# Patient Record
Sex: Female | Born: 2000
Health system: Southern US, Community
[De-identification: ages and names within clinical notes are randomized; demographics above are authoritative.]

## PROBLEM LIST (undated history)

## (undated) DIAGNOSIS — G43909 Migraine, unspecified, not intractable, without status migrainosus: Secondary | ICD-10-CM

## (undated) DIAGNOSIS — R51 Headache: Secondary | ICD-10-CM

## (undated) DIAGNOSIS — R519 Headache, unspecified: Secondary | ICD-10-CM

## (undated) DIAGNOSIS — F419 Anxiety disorder, unspecified: Secondary | ICD-10-CM

## (undated) HISTORY — DX: Headache, unspecified: R51.9

## (undated) HISTORY — DX: Headache: R51

## (undated) HISTORY — DX: Migraine, unspecified, not intractable, without status migrainosus: G43.909

## (undated) HISTORY — PX: KNEE SURGERY: SHX244

## (undated) HISTORY — DX: Anxiety disorder, unspecified: F41.9

## (undated) HISTORY — PX: WISDOM TOOTH EXTRACTION: SHX21

---
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
## 2018-01-26 ENCOUNTER — Encounter (HOSPITAL_COMMUNITY): Payer: Self-pay | Admitting: Emergency Medicine

## 2018-01-26 ENCOUNTER — Emergency Department (HOSPITAL_COMMUNITY): Payer: No Typology Code available for payment source

## 2018-01-26 ENCOUNTER — Emergency Department (HOSPITAL_COMMUNITY)
Admission: EM | Admit: 2018-01-26 | Discharge: 2018-01-26 | Disposition: A | Discharge status: Home / Self Care | Payer: No Typology Code available for payment source | Attending: Emergency Medicine | Admitting: Emergency Medicine

## 2018-01-26 DIAGNOSIS — M25561 Pain in right knee: Secondary | ICD-10-CM | POA: Insufficient documentation

## 2018-01-26 DIAGNOSIS — J45909 Unspecified asthma, uncomplicated: Secondary | ICD-10-CM | POA: Diagnosis not present

## 2018-01-26 DIAGNOSIS — Z79899 Other long term (current) drug therapy: Secondary | ICD-10-CM | POA: Diagnosis not present

## 2018-01-26 DIAGNOSIS — M549 Dorsalgia, unspecified: Secondary | ICD-10-CM | POA: Insufficient documentation

## 2018-01-26 DIAGNOSIS — M542 Cervicalgia: Secondary | ICD-10-CM | POA: Diagnosis not present

## 2018-01-26 DIAGNOSIS — R52 Pain, unspecified: Secondary | ICD-10-CM

## 2018-01-26 LAB — POC URINE PREG, ED: Preg Test, Ur: NEGATIVE

## 2018-01-26 MED ORDER — METHOCARBAMOL 500 MG PO TABS: 500.0000 mg | ORAL_TABLET | Freq: Two times a day (BID) | ORAL | 0 refills | Status: DC

## 2018-01-26 MED ORDER — NAPROXEN 500 MG PO TABS: 500.0000 mg | ORAL_TABLET | Freq: Two times a day (BID) | ORAL | 0 refills | Status: DC

## 2018-01-26 MED ORDER — METHOCARBAMOL 500 MG PO TABS
500.0000 mg | ORAL_TABLET | Freq: Once | ORAL | Status: AC
  Administered 2018-01-26: 500 mg via ORAL
  Filled 2018-01-26: qty 1

## 2018-01-26 MED ORDER — ACETAMINOPHEN 325 MG PO TABS
650.0000 mg | ORAL_TABLET | Freq: Once | ORAL | Status: AC
  Administered 2018-01-26: 650 mg via ORAL
  Filled 2018-01-26: qty 2
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
## 2018-01-26 NOTE — Discharge Instructions (Addendum)
1. Medications: alternate naprosyn and tylenol for pain control, robaxin (muscle relaxant), usual home medications. Do not drive or operate heavy machinery while taking muscle relaxant.  2. Treatment: rest, ice, elevate and use brace, drink plenty of fluids, gentle stretching 3. Follow Up: Please followup with orthopedics or your PCP in 1 week if no improvement for discussion of your diagnoses and further evaluation after today's visit; if you do not have a primary care doctor use the resource guide provided to find one; Please return to the ER for worsening symptoms or other concerns

## 2018-03-06 ENCOUNTER — Ambulatory Visit (HOSPITAL_COMMUNITY)
Admission: EM | Admit: 2018-03-06 | Discharge: 2018-03-06 | Disposition: A | Discharge status: Home / Self Care | Payer: No Typology Code available for payment source | Attending: Family Medicine | Admitting: Family Medicine

## 2018-03-06 ENCOUNTER — Other Ambulatory Visit: Payer: Self-pay

## 2018-03-06 ENCOUNTER — Encounter (HOSPITAL_COMMUNITY): Payer: Self-pay

## 2018-03-06 DIAGNOSIS — B349 Viral infection, unspecified: Secondary | ICD-10-CM | POA: Diagnosis not present

## 2018-03-06 DIAGNOSIS — H6593 Unspecified nonsuppurative otitis media, bilateral: Secondary | ICD-10-CM

## 2018-03-06 MED ORDER — AMOXICILLIN 875 MG PO TABS: 875.0000 mg | ORAL_TABLET | Freq: Two times a day (BID) | ORAL | 0 refills | Status: AC

## 2018-03-06 MED ORDER — IPRATROPIUM BROMIDE 0.06 % NA SOLN: 2.0000 | Freq: Four times a day (QID) | NASAL | 0 refills | Status: DC

## 2018-03-06 MED ORDER — MONTELUKAST SODIUM 10 MG PO TABS: 10.0000 mg | ORAL_TABLET | Freq: Every day | ORAL | 0 refills | Status: DC

## 2018-03-06 NOTE — ED Provider Notes (Signed)
[facility]    CSN: [id number] Arrival date & time: 03/06/18  1115     History   Chief Complaint Chief Complaint  Patient presents with  . Influenza    HPI Stacie Powell is a 18 y.o. female.   18 year old female comes in with father for 1 week history of URI symptoms.  Has had cough, rhinorrhea, nasal congestion, body aches, throat irritation.  Cough is nonproductive without shortness of breath or wheezing.  Denies fever, chills, night sweats.  States for the past few days, started having right ear pain.  She denies painful swallowing, trouble breathing, swelling of the throat, tripoding, drooling, trismus.  OTC cold medication with relief of nasal congestion.  Has not needed to use her inhaler.  Positive sick contact.  Never smoker.     Past Medical History:  Diagnosis Date  . Asthma     Patient Active Problem List   Diagnosis Date Noted  . Migraine without aura and with status migrainosus, not intractable 05/13/2015  . Migraine without aura and without status migrainosus, not intractable 05/13/2015  . Episodic tension-type headache, not intractable 05/13/2015    History reviewed. No pertinent surgical history.  OB History   No obstetric history on file.      Home Medications    Prior to Admission medications   Medication Sig Start Date End Date Taking? Authorizing Provider  amoxicillin (AMOXIL) 875 MG tablet Take 1 tablet (875 mg total) by mouth 2 (two) times daily for 7 days. 03/06/18 03/13/18  Tasia Catchings, Brittnae Aschenbrenner V, PA-C  desloratadine (CLARINEX REDITAB) 5 MG disintegrating tablet Take 5 mg by mouth daily. Reported on 02/14/2015    [provider]  diphenhydrAMINE (BENADRYL) 12.5 MG/5ML liquid Take 25 mg by mouth 4 (four) times daily as needed. Reported on 02/14/2015    [provider]  EPINEPHrine 0.3 mg/0.3 mL IJ SOAJ injection Use as directed for severe allergic reaction 08/30/14   Charmayne Sheer, NP  ibuprofen (ADVIL,MOTRIN) 800 MG tablet  Take 800 mg by mouth every 8 (eight) hours as needed.    [provider]  ipratropium (ATROVENT) 0.06 % nasal spray Place 2 sprays into both nostrils 4 (four) times daily. 03/06/18   Tasia Catchings, Josephine Rudnick V, PA-C  JUNEL FE 1.5/30 1.5-30 MG-MCG tablet Take 1 tablet by mouth daily. 04/17/15   [provider]  Menthol, Topical Analgesic, (BIOFREEZE EX) Apply topically.    [provider]  methocarbamol (ROBAXIN) 500 MG tablet Take 1 tablet (500 mg total) by mouth 2 (two) times daily. 01/26/18   Darlin Drop P, PA-C  montelukast (SINGULAIR) 10 MG tablet Take 1 tablet (10 mg total) by mouth at bedtime. 03/06/18   Tasia Catchings, Taitum Alms V, PA-C  naproxen (NAPROSYN) 500 MG tablet Take 1 tablet (500 mg total) by mouth 2 (two) times daily. 01/26/18   Darlin Drop P, PA-C  ondansetron (ZOFRAN ODT) 4 MG disintegrating tablet Take 1 tablet (4 mg total) by mouth every 8 (eight) hours as needed for nausea or vomiting. 11/19/15   Gareth Morgan, MD    Family History Family History  Problem Relation Age of Onset  . Migraines Mother   . Migraines Father   . Migraines Brother     Social History Social History   Tobacco Use  . Smoking status: Never Smoker  . Smokeless tobacco: Never Used  Substance Use Topics  . Alcohol use: No    Alcohol/week: 0.0 standard drinks  . Drug use: Not on file  Allergies   Cephalosporins; Eggs or egg-derived products; Fish allergy; Naproxen; Peanut-containing drug products; and Shellfish allergy   Review of Systems Review of Systems  Reason unable to perform ROS: See HPI as above.     Physical Exam Triage Vital Signs ED Triage Vitals [03/06/18 1317]  Enc Vitals Group     BP 113/69     Pulse Rate 83     Resp 18     Temp 97.9 F (36.6 C)     Temp Source Tympanic     SpO2 100 %     Weight 140 lb (63.5 kg)     Height      Head Circumference      Peak Flow      Pain Score 4     Pain Loc      Pain Edu?      Excl. in [place]?    No data found.  Updated  Vital Signs BP 113/69 (BP Location: Right Arm)   Pulse 83   Temp 97.9 F (36.6 C) (Tympanic)   Resp 18   Wt 140 lb (63.5 kg)   LMP 12/22/2017 Comment: depo  SpO2 100%   BMI 22.60 kg/m   Visual Acuity Right Eye Distance:   Left Eye Distance:   Bilateral Distance:    Right Eye Near:   Left Eye Near:    Bilateral Near:     Physical Exam Constitutional:      General: She is not in acute distress.    Appearance: She is well-developed. She is not ill-appearing, toxic-appearing or diaphoretic.  HENT:     Head: Normocephalic and atraumatic.     Right Ear: Ear canal and external ear normal. Tympanic membrane is not erythematous or bulging.     Left Ear: Ear canal and external ear normal. A middle ear effusion is present. Tympanic membrane is erythematous. Tympanic membrane is not bulging.     Ears:     Comments: Right TM opaque.     Nose: Rhinorrhea present. No congestion.     Right Sinus: No maxillary sinus tenderness or frontal sinus tenderness.     Left Sinus: No maxillary sinus tenderness or frontal sinus tenderness.     Mouth/Throat:     Mouth: Mucous membranes are moist.     Pharynx: Oropharynx is clear. Uvula midline.  Eyes:     Conjunctiva/sclera: Conjunctivae normal.     Pupils: Pupils are equal, round, and reactive to light.  Neck:     Musculoskeletal: Normal range of motion and neck supple.  Cardiovascular:     Rate and Rhythm: Normal rate and regular rhythm.     Heart sounds: Normal heart sounds. No murmur. No friction rub. No gallop.   Pulmonary:     Effort: Pulmonary effort is normal.     Breath sounds: Normal breath sounds. No decreased breath sounds, wheezing, rhonchi or rales.  Lymphadenopathy:     Cervical: No cervical adenopathy.  Skin:    General: Skin is warm and dry.  Neurological:     Mental Status: She is alert and oriented to person, place, and time.  Psychiatric:        Behavior: Behavior normal.        Judgment: Judgment normal.      UC  Treatments / Results  Labs (all labs ordered are listed, but only abnormal results are displayed) Labs Reviewed - No data to display  EKG None  Radiology No results found.  Procedures Procedures (including critical  care time)  Medications Ordered in UC Medications - No data to display  Initial Impression / Assessment and Plan / UC Course  I have reviewed the triage vital signs and the nursing notes.  Pertinent labs & imaging results that were available during my care of the patient were reviewed by me and considered in my medical decision making (see chart for details).    Discussed exam findings of the ear, will have patient trial Flonase and Atrovent first.  Written prescription of amoxicillin provided, can start for otitis media if symptoms not improving with nasal spray.  Other symptomatic treatment discussed.  Push fluids.  Return precautions given.  Patient expresses understanding and agrees to plan.  Final Clinical Impressions(s) / UC Diagnoses   Final diagnoses:  Viral illness  Fluid level behind tympanic membrane of both ears    ED Prescriptions    Medication Sig Dispense Auth. Provider   ipratropium (ATROVENT) 0.06 % nasal spray Place 2 sprays into both nostrils 4 (four) times daily. 15 mL Ade Stmarie V, PA-C   amoxicillin (AMOXIL) 875 MG tablet Take 1 tablet (875 mg total) by mouth 2 (two) times daily for 7 days. 14 tablet Azra Abrell V, PA-C   montelukast (SINGULAIR) 10 MG tablet Take 1 tablet (10 mg total) by mouth at bedtime. 30 tablet Tobin Chad, [place] 03/06/18 1414

## 2018-03-06 NOTE — ED Triage Notes (Signed)
Pt cc cough, congestion, body aches, stretchy throat x 1 week.

## 2018-03-06 NOTE — Discharge Instructions (Signed)
Start singulair/allergy medicine. Start flonase, atrovent nasal spray for nasal congestion/drainage. You can use over the counter nasal saline rinse such as neti pot for nasal congestion. Keep hydrated, your urine should be clear to pale yellow in color. Tylenol/motrin for fever and pain. Monitor for any worsening of symptoms, chest pain, shortness of breath, wheezing, swelling of the throat, follow up for reevaluation.   If ear pain worsens despite nasal spray, start amoxicillin for ear infection.  For sore throat/cough try using a honey-based tea. Use 3 teaspoons of honey with juice squeezed from half lemon. Place shaved pieces of ginger into 1/2-1 cup of water and warm over stove top. Then mix the ingredients and repeat every 4 hours as needed.

## 2018-04-07 ENCOUNTER — Ambulatory Visit (INDEPENDENT_AMBULATORY_CARE_PROVIDER_SITE_OTHER): Payer: No Typology Code available for payment source | Admitting: Family Medicine

## 2018-04-07 ENCOUNTER — Other Ambulatory Visit: Payer: Self-pay

## 2018-04-07 ENCOUNTER — Encounter: Payer: Self-pay | Admitting: Family Medicine

## 2018-04-07 VITALS — BP 116/82 | HR 104 | Temp 98.5°F | Ht 61.0 in | Wt 148.0 lb

## 2018-04-07 DIAGNOSIS — F419 Anxiety disorder, unspecified: Secondary | ICD-10-CM | POA: Insufficient documentation

## 2018-04-07 DIAGNOSIS — Z8669 Personal history of other diseases of the nervous system and sense organs: Secondary | ICD-10-CM | POA: Diagnosis not present

## 2018-04-07 DIAGNOSIS — J4520 Mild intermittent asthma, uncomplicated: Secondary | ICD-10-CM | POA: Insufficient documentation

## 2018-04-07 DIAGNOSIS — F329 Major depressive disorder, single episode, unspecified: Secondary | ICD-10-CM | POA: Insufficient documentation

## 2018-04-07 DIAGNOSIS — Z7689 Persons encountering health services in other specified circumstances: Secondary | ICD-10-CM

## 2018-04-07 LAB — CBC
HCT: 40.1 % (ref 36.0–49.0)
HEMOGLOBIN: 13.2 g/dL (ref 12.0–16.0)
MCHC: 32.8 g/dL (ref 31.0–37.0)
MCV: 87.6 fl (ref 78.0–98.0)
Platelets: 216 10*3/uL (ref 150.0–575.0)
RBC: 4.58 Mil/uL (ref 3.80–5.70)
RDW: 12.7 % (ref 11.4–15.5)
WBC: 6.1 10*3/uL (ref 4.5–13.5)

## 2018-04-07 LAB — T4, FREE: FREE T4: 0.79 ng/dL (ref 0.60–1.60)

## 2018-04-07 LAB — TSH: TSH: 0.63 u[IU]/mL (ref 0.40–5.00)

## 2018-04-07 NOTE — Progress Notes (Signed)
Patient presents to clinic today to establish care.  SUBJECTIVE: PMH:  Pt is a 18 yo female with pmh sig for history of asthma, anxiety, depression, migraines.  Patient has not had a provider in a while.  History of asthma: -More as a child -No recent symptoms -Had albuterol inhaler as needed  Anxiety/depression: -Symptoms since childhood -Notes increasing symptoms.  Took a semester off from school due to anxiety. -Notes anxiety about multiple things -States currently mood is good.  Sleep could be better, energy is okay. -Taking melatonin nightly -Went to counseling as a child when her parents got divorced -Interested in Designer, fashion/clothing -Has been trying to find a psychiatrist that will take her insurance ([place]) -pt notes her dad has a h/o schizophrenia, bipolar depression.  Pt taking care of her dad  s/p MI, trying to get him to take his medicine on a regular basis.  H/o migraines: -x yrs -triggered by stress and strong smells -may have 2 HAs/mo -endorses nausea -Pain in frontal and temporal areas of head -Take 800 mg ibuprofen or naproxen -Drinking 3 bottles of water per day, 1 cup of coffee  Allergies: Cephalosporins-hands  Social history: Patient is single.  Patient took a semester off from school 2/2 her anxiety.  Patient was in school at [employer] in [place], [place].  Patient is considering changing her major to early childhood education.  Patient is considering taking classes at [employer] over the summer.  Currently working at [employer].  Patient denies alcohol, tobacco, drug use.   Past Medical History:  Diagnosis Date  . Anxiety   . Asthma   . Frequent headaches   . Migraines     Past Surgical History:  Procedure Laterality Date  . KNEE SURGERY    . WISDOM TOOTH EXTRACTION      Current Outpatient Medications on File Prior to Visit  Medication Sig Dispense Refill  . desloratadine (CLARINEX REDITAB) 5 MG disintegrating tablet  Take 5 mg by mouth daily. Reported on 02/14/2015    . diphenhydrAMINE (BENADRYL) 12.5 MG/5ML liquid Take 25 mg by mouth 4 (four) times daily as needed. Reported on 02/14/2015    . EPINEPHrine 0.3 mg/0.3 mL IJ SOAJ injection Use as directed for severe allergic reaction 1 Device 1  . ibuprofen (ADVIL,MOTRIN) 800 MG tablet Take 800 mg by mouth every 8 (eight) hours as needed.    Marland Kitchen ipratropium (ATROVENT) 0.06 % nasal spray Place 2 sprays into both nostrils 4 (four) times daily. 15 mL 0  . Menthol, Topical Analgesic, (BIOFREEZE EX) Apply topically.    . methocarbamol (ROBAXIN) 500 MG tablet Take 1 tablet (500 mg total) by mouth 2 (two) times daily. 20 tablet 0  . montelukast (SINGULAIR) 10 MG tablet Take 1 tablet (10 mg total) by mouth at bedtime. 30 tablet 0  . naproxen (NAPROSYN) 500 MG tablet Take 1 tablet (500 mg total) by mouth 2 (two) times daily. 30 tablet 0  . ondansetron (ZOFRAN ODT) 4 MG disintegrating tablet Take 1 tablet (4 mg total) by mouth every 8 (eight) hours as needed for nausea or vomiting. 16 tablet 0   No current facility-administered medications on file prior to visit.     Allergies  Allergen Reactions  . Cephalosporins Hives  . Eggs Or Egg-Derived Products   . Fish Allergy   . Naproxen   . Peanut-Containing Drug Products   . Shellfish Allergy     Family History  Problem Relation Age of Onset  .  Migraines Mother   . Migraines Father   . Migraines Brother     Social History   Socioeconomic History  . Marital status: Single    Spouse name: Not on file  . Number of children: Not on file  . Years of education: Not on file  . Highest education level: Not on file  Occupational History  . Not on file  Social Needs  . Financial resource strain: Not on file  . Food insecurity:    Worry: Not on file    Inability: Not on file  . Transportation needs:    Medical: Not on file    Non-medical: Not on file  Tobacco Use  . Smoking status: Never Smoker  . Smokeless  tobacco: Never Used  Substance and Sexual Activity  . Alcohol use: No    Alcohol/week: 0.0 standard drinks  . Drug use: Not on file  . Sexual activity: Never    Birth control/protection: Abstinence, Injection  Lifestyle  . Physical activity:    Days per week: Not on file    Minutes per session: Not on file  . Stress: Not on file  Relationships  . Social connections:    Talks on phone: Not on file    Gets together: Not on file    Attends religious service: Not on file    Active member of club or organization: Not on file    Attends meetings of clubs or organizations: Not on file    Relationship status: Not on file  . Intimate partner violence:    Fear of current or ex partner: Not on file    Emotionally abused: Not on file    Physically abused: Not on file    Forced sexual activity: Not on file  Other Topics Concern  . Not on file  Social History Narrative   Chandria is an 11th grader at [employer]. She is doing very well. She libes with both parents and she has two brother, 66 yo & 46 yo. She enjoys eating, sleeping, and running track.    ROS General: Denies fever, chills, night sweats, changes in weight, changes in appetite HEENT: Denies ear pain, changes in vision, rhinorrhea, sore throat  +HAs CV: Denies CP, palpitations, SOB, orthopnea Pulm: Denies SOB, cough, wheezing GI: Denies abdominal pain, nausea, vomiting, diarrhea, constipation GU: Denies dysuria, hematuria, frequency, vaginal discharge Msk: Denies muscle cramps, joint pains Neuro: Denies weakness, numbness, tingling Skin: Denies rashes, bruising Psych: Denies hallucinations  + anxiety and depression   BP 116/82 (BP Location: Left Arm, Patient Position: Sitting, Cuff Size: Normal)   Pulse (!) 104   Temp 98.5 F (36.9 C) (Oral)   Ht 5\' 1"  (1.549 m)   Wt 148 lb (67.1 kg)   SpO2 98%   BMI 27.96 kg/m   Physical Exam Gen. Pleasant, well developed, well-nourished, in NAD HEENT - [place]/AT,  PERRL, no scleral icterus, no nasal drainage, pharynx without erythema or exudate.  TMs normal bilaterally. Neck: No JVD, no thyromegaly, no carotid bruits Lungs: no use of accessory muscles, CTAB, no wheezes, rales or rhonchi Cardiovascular: RRR, No r/g/m, no peripheral edema Abdomen: BS present, soft, nontender,nondistended Neuro:  A&Ox3, CN II-XII intact, normal gait Skin:  Warm, dry, intact, no lesions  Recent Results (from the past 2160 hour(s))  POC Urine Pregnancy, ED (do NOT order at [facility])     Status: None   Collection Time: 01/26/18  6:03 PM  Result Value Ref Range   Preg Test,  Ur NEGATIVE NEGATIVE    Comment:        THE SENSITIVITY OF THIS METHODOLOGY IS >24 mIU/mL     Assessment/Plan: Anxiety and depression -PHQ 9 score 19 -Gad 7 score 20 -Discussed making appointment with psychiatry.  Pt given a list of area [facility] providers if needed. -discussed starting SSRI after labs result.  Consider lexapro or paxil. - Plan: TSH, T4, free, CBC (no diff)  Mild intermittent asthma without complication -Controlled -We will continue to monitor  History of migraine -Given handout -Discussed ways to reduce headaches including increasing p.o. intake of water, limiting caffeine, getting plenty of rest, decreasing stress - Plan: CBC (no diff)  Encounter to establish care -We reviewed the PMH, PSH, FH, SH, Meds and Allergies. -We provided refills for any medications we will prescribe as needed. -We addressed current concerns per orders and patient instructions. -We have asked for records for pertinent exams, studies, vaccines and notes from previous providers. -We have advised patient to follow up per instructions below.  F/u in 4-6 wks, sooner if needed  Grier Mitts, MD

## 2018-04-07 NOTE — Patient Instructions (Signed)
Living With Anxiety  After being diagnosed with an anxiety disorder, you may be relieved to know why you have felt or behaved a certain way. It is natural to also feel overwhelmed about the treatment ahead and what it will mean for your life. With care and support, you can manage this condition and recover from it. How to cope with anxiety Dealing with stress Stress is your body's reaction to life changes and events, both good and bad. Stress can last just a few hours or it can be ongoing. Stress can play a major role in anxiety, so it is important to learn both how to cope with stress and how to think about it differently. Talk with your health care provider or a counselor to learn more about stress reduction. He or she may suggest some stress reduction techniques, such as:  Music therapy. This can include creating or listening to music that you enjoy and that inspires you.  Mindfulness-based meditation. This involves being aware of your normal breaths, rather than trying to control your breathing. It can be done while sitting or walking.  Centering prayer. This is a kind of meditation that involves focusing on a word, phrase, or sacred image that is meaningful to you and that brings you peace.  Deep breathing. To do this, expand your stomach and inhale slowly through your nose. Hold your breath for 3-5 seconds. Then exhale slowly, allowing your stomach muscles to relax.  Self-talk. This is a skill where you identify thought patterns that lead to anxiety reactions and correct those thoughts.  Muscle relaxation. This involves tensing muscles then relaxing them. Choose a stress reduction technique that fits your lifestyle and personality. Stress reduction techniques take time and practice. Set aside 5-15 minutes a day to do them. Therapists can offer training in these techniques. The training may be covered by some insurance plans. Other things you can do to manage stress include:  Keeping a  stress diary. This can help you learn what triggers your stress and ways to control your response.  Thinking about how you respond to certain situations. You may not be able to control everything, but you can control your reaction.  Making time for activities that help you relax, and not feeling guilty about spending your time in this way. Therapy combined with coping and stress-reduction skills provides the best chance for successful treatment. Medicines Medicines can help ease symptoms. Medicines for anxiety include:  Anti-anxiety drugs.  Antidepressants.  Beta-blockers. Medicines may be used as the main treatment for anxiety disorder, along with therapy, or if other treatments are not working. Medicines should be prescribed by a health care provider. Relationships Relationships can play a big part in helping you recover. Try to spend more time connecting with trusted friends and family members. Consider going to couples counseling, taking family education classes, or going to family therapy. Therapy can help you and others better understand the condition. How to recognize changes in your condition Everyone has a different response to treatment for anxiety. Recovery from anxiety happens when symptoms decrease and stop interfering with your daily activities at home or work. This may mean that you will start to:  Have better concentration and focus.  Sleep better.  Be less irritable.  Have more energy.  Have improved memory. It is important to recognize when your condition is getting worse. Contact your health care provider if your symptoms interfere with home or work and you do not feel like your condition is improving. Where   interfere with home or work and you do not feel like your condition is improving.  Where to find help and support:  You can get help and support from these sources:  Self-help groups.  Online and community organizations.  A trusted spiritual leader.  Couples counseling.  Family education classes.  Family therapy.  Follow these instructions at home:  Eat a healthy diet that includes plenty  of vegetables, fruits, whole grains, low-fat dairy products, and lean protein. Do not eat a lot of foods that are high in solid fats, added sugars, or salt.  Exercise. Most adults should do the following:  Exercise for at least 150 minutes each week. The exercise should increase your heart rate and make you sweat (moderate-intensity exercise).  Strengthening exercises at least twice a week.  Cut down on caffeine, tobacco, alcohol, and other potentially harmful substances.  Get the right amount and quality of sleep. Most adults need 7-9 hours of sleep each night.  Make choices that simplify your life.  Take over-the-counter and prescription medicines only as told by your health care provider.  Avoid caffeine, alcohol, and certain over-the-counter cold medicines. These may make you feel worse. Ask your pharmacist which medicines to avoid.  Keep all follow-up visits as told by your health care provider. This is important.  Questions to ask your health care provider  Would I benefit from therapy?  How often should I follow up with a health care provider?  How long do I need to take medicine?  Are there any long-term side effects of my medicine?  Are there any alternatives to taking medicine?  Contact a health care provider if:  You have a hard time staying focused or finishing daily tasks.  You spend many hours a day feeling worried about everyday life.  You become exhausted by worry.  You start to have headaches, feel tense, or have nausea.  You urinate more than normal.  You have diarrhea.  Get help right away if:  You have a racing heart and shortness of breath.  You have thoughts of hurting yourself or others.  If you ever feel like you may hurt yourself or others, or have thoughts about taking your own life, get help right away. You can go to your nearest emergency department or call:  Your local emergency services (911 in the [place]  A suicide crisis helpline, such as the National Suicide Prevention Lifeline at  1-800-273-8255. This is open 24-hours a day.  Summary  Taking steps to deal with stress can help calm you.  Medicines cannot cure anxiety disorders, but they can help ease symptoms.  Family, friends, and partners can play a big part in helping you recover from an anxiety disorder.  This information is not intended to replace advice given to you by your health care provider. Make sure you discuss any questions you have with your health care provider.  Document Released: 12/31/2015 Document Revised: 12/31/2015 Document Reviewed: 12/31/2015  Elsevier Interactive Patient Education  2019 [employer] Inc[employer]  Living With Depression  Everyone experiences occasional disappointment, sadness, and loss in their lives. When you are feeling down, blue, or sad for at least 2 weeks in a row, it may mean that you have depression. Depression can affect your thoughts and feelings, relationships, daily activities, and physical health. It is caused by changes in the way your brain functions. If you receive a diagnosis of depression, your health care provider will tell you which type of depression you have and what   treatment options are available to you.  If you are living with depression, there are ways to help you recover from it and also ways to prevent it from coming back.  How to cope with lifestyle changes  Coping with stress         Stress is your body's reaction to life changes and events, both good and bad. Stressful situations may include:  Getting married.  The death of a spouse.  Losing a job.  Retiring.  Having a baby.  Stress can last just a few hours or it can be ongoing. Stress can play a major role in depression, so it is important to learn both how to cope with stress and how to think about it differently.  Talk with your health care provider or a counselor if you would like to learn more about stress reduction. He or she may suggest some stress reduction techniques, such as:  Music therapy. This can include creating music  or listening to music. Choose music that you enjoy and that inspires you.  Mindfulness-based meditation. This kind of meditation can be done while sitting or walking. It involves being aware of your normal breaths, rather than trying to control your breathing.  Centering prayer. This is a kind of meditation that involves focusing on a spiritual word or phrase. Choose a word, phrase, or sacred image that is meaningful to you and that brings you peace.  Deep breathing. To do this, expand your stomach and inhale slowly through your nose. Hold your breath for 3-5 seconds, then exhale slowly, allowing your stomach muscles to relax.  Muscle relaxation. This involves intentionally tensing muscles then relaxing them.  Choose a stress reduction technique that fits your lifestyle and personality. Stress reduction techniques take time and practice to develop. Set aside 5-15 minutes a day to do them. Therapists can offer training in these techniques. The training may be covered by some insurance plans. Other things you can do to manage stress include:  Keeping a stress diary. This can help you learn what triggers your stress and ways to control your response.  Understanding what your limits are and saying no to requests or events that lead to a schedule that is too full.  Thinking about how you respond to certain situations. You may not be able to control everything, but you can control how you react.  Adding humor to your life by watching funny films or TV shows.  Making time for activities that help you relax and not feeling guilty about spending your time this way.    Medicines  Your health care provider may suggest certain medicines if he or she feels that they will help improve your condition. Avoid using alcohol and other substances that may prevent your medicines from working properly (may interact). It is also important to:  Talk with your pharmacist or health care provider about all the medicines that you take, their  possible side effects, and what medicines are safe to take together.  Make it your goal to take part in all treatment decisions (shared decision-making). This includes giving input on the side effects of medicines. It is best if shared decision-making with your health care provider is part of your total treatment plan.  If your health care provider prescribes a medicine, you may not notice the full benefits of it for 4-8 weeks. Most people who are treated for depression need to be on medicine for at least 6-12 months after they feel better. If you are   taking medicines as part of your treatment, do not stop taking medicines without first talking to your health care provider. You may need to have the medicine slowly decreased (tapered) over time to decrease the risk of harmful side effects.  Relationships  Your health care provider may suggest family therapy along with individual therapy and drug therapy. While there may not be family problems that are causing you to feel depressed, it is still important to make sure your family learns as much as they can about your mental health. Having your family's support can help make your treatment successful.  How to recognize changes in your condition  Everyone has a different response to treatment for depression. Recovery from major depression happens when you have not had signs of major depression for two months. This may mean that you will start to:  Have more interest in doing activities.  Feel less hopeless than you did 2 months ago.  Have more energy.  Overeat less often, or have better or improving appetite.  Have better concentration.  Your health care provider will work with you to decide the next steps in your recovery. It is also important to recognize when your condition is getting worse. Watch for these signs:  Having fatigue or low energy.  Eating too much or too little.  Sleeping too much or too little.  Feeling restless, agitated, or hopeless.  Having trouble  concentrating or making decisions.  Having unexplained physical complaints.  Feeling irritable, angry, or aggressive.  Get help as soon as you or your family members notice these symptoms coming back.  How to get support and help from others  How to talk with friends and family members about your condition    Talking to friends and family members about your condition can provide you with one way to get support and guidance. Reach out to trusted friends or family members, explain your symptoms to them, and let them know that you are working with a health care provider to treat your depression.  Financial resources  Not all insurance plans cover mental health care, so it is important to check with your insurance carrier. If paying for co-pays or counseling services is a problem, search for a local or county mental health care center. They may be able to offer public mental health care services at low or no cost when you are not able to see a private health care provider.  If you are taking medicine for depression, you may be able to get the generic form, which may be less expensive. Some makers of prescription medicines also offer help to patients who cannot afford the medicines they need.  Follow these instructions at home:    Get the right amount and quality of sleep.  Cut down on using caffeine, tobacco, alcohol, and other potentially harmful substances.  Try to exercise, such as walking or lifting small weights.  Take over-the-counter and prescription medicines only as told by your health care provider.  Eat a healthy diet that includes plenty of vegetables, fruits, whole grains, low-fat dairy products, and lean protein. Do not eat a lot of foods that are high in solid fats, added sugars, or salt.  Keep all follow-up visits as told by your health care provider. This is important.  Contact a health care provider if:  You stop taking your antidepressant medicines, and you have any of these  symptoms:  Nausea.  Headache.  Feeling lightheaded.  Chills and body aches.  Not being able   to sleep (insomnia).  You or your friends and family think your depression is getting worse.  Get help right away if:  You have thoughts of hurting yourself or others.  If you ever feel like you may hurt yourself or others, or have thoughts about taking your own life, get help right away. You can go to your nearest emergency department or call:  Your local emergency services (911 in the [place]  A suicide crisis helpline, such as the National Suicide Prevention Lifeline at 1-800-273-8255. This is open 24-hours a day.  Summary  If you are living with depression, there are ways to help you recover from it and also ways to prevent it from coming back.  Work with your health care team to create a management plan that includes counseling, stress management techniques, and healthy lifestyle habits.  This information is not intended to replace advice given to you by your health care provider. Make sure you discuss any questions you have with your health care provider.  Document Released: 12/09/2015 Document Revised: 12/09/2015 Document Reviewed: 12/09/2015  Elsevier Interactive Patient Education  2019 [employer]

## 2018-04-08 ENCOUNTER — Other Ambulatory Visit: Payer: Self-pay | Admitting: Family Medicine

## 2018-04-08 DIAGNOSIS — F32A Depression, unspecified: Secondary | ICD-10-CM

## 2018-04-08 DIAGNOSIS — F419 Anxiety disorder, unspecified: Principal | ICD-10-CM

## 2018-04-08 DIAGNOSIS — F329 Major depressive disorder, single episode, unspecified: Secondary | ICD-10-CM

## 2018-04-08 MED ORDER — ESCITALOPRAM OXALATE 5 MG PO TABS: 5.0000 mg | ORAL_TABLET | Freq: Every day | ORAL | 1 refills | Status: DC

## 2018-04-14 ENCOUNTER — Encounter: Payer: Self-pay | Admitting: Family Medicine

## 2018-04-14 ENCOUNTER — Telehealth: Payer: No Typology Code available for payment source | Admitting: Physician Assistant

## 2018-04-14 DIAGNOSIS — N898 Other specified noninflammatory disorders of vagina: Secondary | ICD-10-CM

## 2018-04-14 DIAGNOSIS — N941 Unspecified dyspareunia: Secondary | ICD-10-CM

## 2018-04-14 NOTE — Progress Notes (Signed)
Based on what you shared with me, I feel your condition warrants further evaluation and I recommend that you be seen for a face to face office visit.  Based on what you have shared this is not likely a typical yeast infection. I recommend that you see a provider in person for a vaginal exam to rule out the possibility of pelvic inflammatory disease and other causes of symptoms.     NOTE: If you entered your credit card information for this eVisit, you will not be charged. You may see a "hold" on your card for the $35 but that hold will drop off and you will not have a charge processed.  If you are having a true medical emergency please call 911.  If you need an urgent face to face visit, [facility] has four urgent care centers for your convenience.    PLEASE NOTE: THE [facility]AC[facility] LOCATIONS AND URGENT CARE CLINICS DO NOT HAVE THE TESTING FOR CORONAVIRUS COVID19 AVAILABLE.  IF YOU FEEL YOU NEED THIS TEST YOU MUST HAVE AN ORDER TO GO TO A TESTING LOCATION FROM YOUR PROVIDER OR FROM A SCREENING E-VISIT     [email or web address] to reserve your spot online an avoid wait times  [facility] [address], [address] [place], [place] [address] 8 am to 8 pm Monday-Friday 10 am to 4 pm Saturday-Sunday *Across the street from [employer]  [place], [address] 8 am to 5 pm Monday-Friday * In the [facility] on the [facility]   The following sites will take your insurance:  . [facility] [place] a Provider at this Location  [address] [place], [place] [address] . 10 am to 8 pm Monday-Friday . 12 pm to 8 pm Saturday-Sunday   . [facility] at [place] a Provider at this Location  [address], [place], [place] [address] . 8 am to 8 pm Monday-Friday . 9 am to 6 pm Saturday . 11 am  to 6 pm Sunday   . [facility] at [place] Get Driving Directions  [address] [place], [place] [address] . 8 am to 8 pm Monday-Friday . 8 am to 4 pm Saturday-Sunday   Your e-visit answers were reviewed by a board certified advanced clinical practitioner to complete your personal care plan.  Thank you for using e-Visits.

## 2018-04-26 ENCOUNTER — Encounter: Payer: Self-pay | Admitting: Family Medicine

## 2018-04-27 ENCOUNTER — Other Ambulatory Visit: Payer: Self-pay

## 2018-04-27 ENCOUNTER — Encounter: Payer: Self-pay | Admitting: Family Medicine

## 2018-04-27 ENCOUNTER — Ambulatory Visit (INDEPENDENT_AMBULATORY_CARE_PROVIDER_SITE_OTHER): Payer: No Typology Code available for payment source | Admitting: Family Medicine

## 2018-04-27 ENCOUNTER — Telehealth: Payer: Self-pay

## 2018-04-27 DIAGNOSIS — L709 Acne, unspecified: Secondary | ICD-10-CM

## 2018-04-27 DIAGNOSIS — L989 Disorder of the skin and subcutaneous tissue, unspecified: Secondary | ICD-10-CM

## 2018-04-27 DIAGNOSIS — J069 Acute upper respiratory infection, unspecified: Secondary | ICD-10-CM | POA: Diagnosis not present

## 2018-04-27 DIAGNOSIS — F419 Anxiety disorder, unspecified: Secondary | ICD-10-CM

## 2018-04-27 DIAGNOSIS — M25561 Pain in right knee: Secondary | ICD-10-CM | POA: Diagnosis not present

## 2018-04-27 DIAGNOSIS — F329 Major depressive disorder, single episode, unspecified: Secondary | ICD-10-CM

## 2018-04-27 MED ORDER — AMOXICILLIN 500 MG PO CAPS: 500.0000 mg | ORAL_CAPSULE | Freq: Two times a day (BID) | ORAL | 0 refills | Status: AC

## 2018-04-27 MED ORDER — ESCITALOPRAM OXALATE 5 MG PO TABS: 5.0000 mg | ORAL_TABLET | Freq: Every day | ORAL | 4 refills | Status: DC

## 2018-04-27 MED FILL — AMOXICILLIN 500 MG CAPSULE: 500 | 7 days supply | Qty: 14 | Fill #0

## 2018-04-27 NOTE — Progress Notes (Signed)
Virtual Visit via Video Note  I connected with Stacie Powell on 04/27/18 at 10:00 AM EDT by a video enabled telemedicine application and verified that I am speaking with the correct person using two identifiers.  Location patient: home Location provider:home office Persons participating in the virtual visit: patient, provider  I discussed the limitations of evaluation and management by telemedicine and the availability of in person appointments. The patient expressed understanding and agreed to proceed.   HPI: Pt with ear fullness and sore throat x 2 days.  States throat is swollen and her appetite is decreased.  Pt also endorses HA, pain in cheeks, rhinorrhea, sneezing.  Denies cough, fever. Took citirizine, dayquil, nyquil, gargling with salt water and vinegar lemon spray, flonase.  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  Pt tore her R MCL in high school.  S/p repair and readjustment at [place] heard a rip noise when stepping on stool several days ago.  Now hearing a pop noise, having pain with bending, and mild bruising of R knee.  The pain is causing migraines.  Pt is interested in seeing Ortho.  Acne: Pt inquires about what to use on her skin.  Currently using OTC cleanser, toner, and moisturizer.  States in the past cetaphil and vaseline have caused breakouts.  Drinking 4 glasses of water per day.  Anxiety and depression:  Pt taking lexapro 5 mg daily.  Noticed a difference overall the day she started taking the medication.  Notes feeling calmer and less stressed/anxious.  Energy and mood are good.  Pt does note waking up in the middle of the night for an hour or so.  May take melatonin to go back to sleep.  Has one refill on med.  Would like to have RXs sent to [place].  Pt notes 2 bumps on her thighs.  Notes bumps become irritated with wearing jeans.  Similar to pimples with mild erythema.  Tried cortisone cream.   Areas seem to be improving.  Erythema resolving.  Denies fever, chills.  ROS: See pertinent positives and negatives per HPI.  Past Medical History:  Diagnosis Date  . Anxiety   . Asthma   . Frequent headaches   . Migraines     Past Surgical History:  Procedure Laterality Date  . KNEE SURGERY    . WISDOM TOOTH EXTRACTION      Family History  Problem Relation Age of Onset  . Migraines Mother   . Migraines Father   . Migraines Brother     SOCIAL HX: Pt is a Ship broker at [employer].  Currently working at [employer].   Current Outpatient Medications:  .  desloratadine (CLARINEX REDITAB) 5 MG disintegrating tablet, Take 5 mg by mouth daily. Reported on 02/14/2015, Disp: , Rfl:  .  diphenhydrAMINE (BENADRYL) 12.5 MG/5ML liquid, Take 25 mg by mouth 4 (four) times daily as needed. Reported on 02/14/2015, Disp: , Rfl:  .  EPINEPHrine 0.3 mg/0.3 mL IJ SOAJ injection, Use as directed for severe allergic reaction, Disp: 1 Device, Rfl: 1 .  escitalopram (LEXAPRO) 5 MG tablet, Take 1 tablet (5 mg total) by mouth daily., Disp: 30 tablet, Rfl: 1 .  ibuprofen (ADVIL,MOTRIN) 800 MG tablet, Take 800 mg by mouth every 8 (eight) hours as needed., Disp: , Rfl:  .  ipratropium (ATROVENT) 0.06 % nasal spray, Place 2 sprays into both nostrils 4 (four) times daily., Disp: 15 mL, Rfl: 0 .  Menthol, Topical Analgesic, (BIOFREEZE EX), Apply topically., Disp: , Rfl:  .  methocarbamol (ROBAXIN) 500 MG tablet, Take 1 tablet (500 mg total) by mouth 2 (two) times daily., Disp: 20 tablet, Rfl: 0 .  montelukast (SINGULAIR) 10 MG tablet, Take 1 tablet (10 mg total) by mouth at bedtime., Disp: 30 tablet, Rfl: 0 .  naproxen (NAPROSYN) 500 MG tablet, Take 1 tablet (500 mg total) by mouth 2 (two) times daily., Disp: 30 tablet, Rfl: 0 .  ondansetron (ZOFRAN ODT) 4 MG disintegrating tablet, Take 1 tablet (4 mg total) by mouth every 8 (eight) hours as needed for nausea or vomiting., Disp: 16 tablet, Rfl: 0  EXAM:  VITALS per  patient if applicable:  Temp 16.1W.  RR between 12-20 bpm.  GENERAL: alert, oriented, appears well and in no acute distress  HEENT: atraumatic, conjunctiva clear, no obvious abnormalities on inspection of external nose and ears.  Several closed comedomes on forehead.  Mild erythema of pharynx.  NECK: normal movements of the head and neck  LUNGS: on inspection no signs of respiratory distress, breathing rate appears normal, no obvious gross SOB, gasping or wheezing  CV: no obvious cyanosis  MS: moves all visible extremities without noticeable abnormality  PSYCH/NEURO: pleasant and cooperative, no obvious depression or anxiety, speech and thought processing grossly intact  ASSESSMENT AND PLAN:  Discussed the following assessment and plan:  Viral URI -supportive care.  Continue current therapies. -discussed abx overuse. -discussed wait and see rx for sinusitis if facial pain continues or worsens over the next 3+ days.  Rx for amoxicillin at pharmacy.  Acute pain of right knee -concern for re-injury of MCL -given h/o MCL repair and knee surgery, pt advised to reach out to previous Ortho provider as it may be easier to be seen there. -discussed ice, rest, NSAIDS, etc. -pt to notify clinic if referral needed.  Acne, unspecified acne type -discussed cleaning face twice a day and using a moisturizer -discussed staying hydrated and reducing stress. -ok to use Vitamin E oil on dark marks.  Anxiety and depression -improving. -continue lexapro 5 mg daily.  Refills sent to pharmacy. -will have pt f/u in the next few months, sooner if needed.  Skin lesion -improving -continue cortisone prn -avoid tight clothing that can rub on area.   -given precautions.    I discussed the assessment and treatment plan with the patient. The patient was provided an opportunity to ask questions and all were answered. The patient agreed with the plan and demonstrated an understanding of the  instructions.   The patient was advised to call back or seek an in-person evaluation if the symptoms worsen or if the condition fails to improve as anticipated.  Billie Ruddy, MD

## 2018-04-27 NOTE — Telephone Encounter (Signed)
Pt is scheduled to see dr Volanda Napoleon this morning

## 2018-05-05 MED FILL — ESCITALOPRAM 5 MG TABLET: 5 | 30 days supply | Qty: 30 | Fill #0

## 2018-06-01 ENCOUNTER — Telehealth: Payer: Self-pay | Admitting: Family Medicine

## 2018-06-01 ENCOUNTER — Other Ambulatory Visit: Payer: Self-pay

## 2018-06-01 DIAGNOSIS — M25572 Pain in left ankle and joints of left foot: Secondary | ICD-10-CM

## 2018-06-01 NOTE — Telephone Encounter (Signed)
Copied from [place] [id number]. Topic: Referral - Request for Referral >> Jun 01, 2018 11:07 AM Oneta Rack wrote:  Relation to pt: self  Call back number: [phone]   Reason for call:  Patient right ankle swollen and cant place pressure for 2 days. Please send referral to Dollene Primrose, MD [facility] [place], [place], [place] [address] Phone: [phone] fax # [phone] referral confirmation # [id number] Patient has appoitnment tomorrow, please advise

## 2018-06-01 NOTE — Telephone Encounter (Signed)
Spoke with pt Mother who is on the [facility] aware that a referral was placed as requested, pt already made her own appointment for 06/02/2018 at 8 am with [facility].

## 2018-06-01 NOTE — Telephone Encounter (Signed)
Refarral was faxed to [facility] [employer].

## 2018-06-02 ENCOUNTER — Encounter: Payer: Self-pay | Admitting: Orthopaedic Surgery

## 2018-06-02 ENCOUNTER — Ambulatory Visit (INDEPENDENT_AMBULATORY_CARE_PROVIDER_SITE_OTHER): Payer: No Typology Code available for payment source

## 2018-06-02 ENCOUNTER — Other Ambulatory Visit: Payer: Self-pay

## 2018-06-02 ENCOUNTER — Other Ambulatory Visit: Payer: Self-pay | Admitting: Orthopaedic Surgery

## 2018-06-02 ENCOUNTER — Ambulatory Visit (INDEPENDENT_AMBULATORY_CARE_PROVIDER_SITE_OTHER): Payer: No Typology Code available for payment source | Admitting: Orthopaedic Surgery

## 2018-06-02 DIAGNOSIS — M25571 Pain in right ankle and joints of right foot: Secondary | ICD-10-CM

## 2018-06-02 MED FILL — ESCITALOPRAM 5 MG TABLET: 5 | 30 days supply | Qty: 30 | Fill #1

## 2018-06-02 NOTE — Progress Notes (Signed)
Office Visit Note   Patient: Stacie Powell           Date of Birth: September 13, 2000           MRN: [id number] Visit Date: 06/02/2018              Requested by: Billie Ruddy, MD [place], [place] [address] PCP: Billie Ruddy, MD   Assessment & Plan: Visit Diagnoses:  1. Pain in right ankle and joints of right foot     Plan: Impression is grade 1-2 ankle sprain, right ankle.  We will place the patient in a cam walker weightbearing as tolerated.  She will continue to ice and elevate for pain and swelling.  Take over-the-counter medications as needed for pain.  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  She will follow-up with [place] in 3 weeks time for recheck.  Follow-Up Instructions: Return in about 3 weeks (around 06/23/2018).   Orders:  Orders Placed This Encounter  Procedures  . XR Ankle Complete Right   No orders of the defined types were placed in this encounter.     Procedures: No procedures performed   Clinical Data: No additional findings.   Subjective: Chief Complaint  Patient presents with  . Right Ankle - Pain    HPI patient is a pleasant 18 year old girl who comes in today with a new injury to her right ankle.  Approximately 3 days ago she was bending down a set of stairs with her dog when she inverted her right ankle.  She has had pain to the lateral aspect since.  She does note occasional swelling.  She has been using ice and compression with mild relief of pain.  The pain is intensified with ambulation as well as with squatting.  She has tried ibuprofen mild relief of symptoms.  She does have a history of previous right ankle sprain from cheerleading a few years ago.  Review of Systems as detailed in HPI.  All others reviewed are   Objective: Vital Signs: There were no vitals taken for this visit. [facility] Kitchen Physical Exam  well-developed well-nourished female in no acute distress.  Alert and oriented x3.  Ortho Exam examination of her right ankle reveals minimal swelling.  Moderate tenderness over the lateral malleolus.  Mild tenderness over the ATFL.  Increased pain with dorsiflexion and eversion.  Negative anterior drawer negative talar tilt.  Calf is soft and nontender.  She is neurovascularly intact distally.  Specialty Comments:  No specialty comments available.  Imaging: Xr Ankle Complete Right  Result Date: 06/02/2018 No acute or structural abnormalities.  There does appear to be small ossification to the lateral malleolus.    PMFS History: Patient Active Problem List   Diagnosis Date Noted  . Pain in right ankle and joints of right foot 06/02/2018  . Anxiety and depression 04/07/2018  . Mild intermittent asthma without complication 84/13/2440  . Migraine without aura and with status migrainosus, not intractable 05/13/2015  . Migraine without aura and without status migrainosus, not intractable 05/13/2015  . Episodic tension-type headache, not intractable 05/13/2015   Past Medical History:  Diagnosis Date  . Anxiety   . Asthma   . Frequent headaches   . Migraines     Family History  Problem Relation Age of Onset  . Migraines Mother   . Migraines Father   .  Migraines Brother     Past Surgical History:  Procedure Laterality Date  . KNEE SURGERY    . WISDOM TOOTH EXTRACTION     Social History   Occupational History  . Not on file  Tobacco Use  . Smoking status: Never Smoker  . Smokeless tobacco: Never Used  Substance and Sexual Activity  . Alcohol use: No    Alcohol/week: 0.0 standard drinks  . Drug use: Not on file  . Sexual activity: Never    Birth control/protection: Abstinence, Injection

## 2018-06-06 ENCOUNTER — Telehealth: Payer: Self-pay | Admitting: Orthopaedic Surgery

## 2018-06-06 NOTE — Telephone Encounter (Signed)
See message below °

## 2018-06-06 NOTE — Telephone Encounter (Signed)
Patient called stating that Dr. Erlinda Hong would write her out of work if needed. Patient would like a note with a week out of work. Left job fax number [phone]. Patient would also like a phone call letting her know it was done.

## 2018-06-06 NOTE — Telephone Encounter (Signed)
Patient called back to specify that she wanted to return on Friday, 06/10/18. Note to be written From 5/16-522.

## 2018-06-06 NOTE — Telephone Encounter (Signed)
yes

## 2018-06-06 NOTE — Telephone Encounter (Signed)
Patient called asked for a call back concerning note for her. The number to contact patient is [phone]

## 2018-06-07 NOTE — Telephone Encounter (Signed)
Patient aware.

## 2018-06-07 NOTE — Telephone Encounter (Signed)
Note faxed.

## 2018-06-23 ENCOUNTER — Ambulatory Visit: Payer: No Typology Code available for payment source | Admitting: Orthopaedic Surgery

## 2018-07-04 MED FILL — ESCITALOPRAM 5 MG TABLET: 5 | 30 days supply | Qty: 30 | Fill #0

## 2018-07-28 ENCOUNTER — Encounter: Payer: Self-pay | Admitting: Family Medicine

## 2018-07-28 ENCOUNTER — Ambulatory Visit (INDEPENDENT_AMBULATORY_CARE_PROVIDER_SITE_OTHER): Payer: No Typology Code available for payment source | Admitting: Family Medicine

## 2018-07-28 ENCOUNTER — Other Ambulatory Visit: Payer: Self-pay

## 2018-07-28 DIAGNOSIS — F329 Major depressive disorder, single episode, unspecified: Secondary | ICD-10-CM | POA: Diagnosis not present

## 2018-07-28 DIAGNOSIS — F419 Anxiety disorder, unspecified: Secondary | ICD-10-CM | POA: Diagnosis not present

## 2018-07-28 MED ORDER — ESCITALOPRAM OXALATE 10 MG PO TABS: 10.0000 mg | ORAL_TABLET | Freq: Every day | ORAL | 3 refills | Status: DC

## 2018-07-28 MED FILL — ESCITALOPRAM 10 MG TABLET: 10 | 30 days supply | Qty: 30 | Fill #0

## 2018-07-28 NOTE — Progress Notes (Signed)
Virtual Visit via Video Note  I connected with Stacie Powell on 07/28/18 at 11:30 AM EDT by a video enabled telemedicine application and verified that I am speaking with the correct person using two identifiers.  Location patient: home Location provider:work or home office Persons participating in the virtual visit: patient, provider  I discussed the limitations of evaluation and management by telemedicine and the availability of in person appointments. The patient expressed understanding and agreed to proceed.   HPI: Pt started lexapro 5 after last visit.  Pt feels like med is working but still having a few episodes of panic/increased anxiety.  May happen 3x/wk.  States job is stressful.  Pt has been meditating, reading more, and exercising.  Sleep is "perfect", mood is ok, energy is ok but may still feel drained if gets enough sleep.   Drinking more water.  Spending more time with one of her friends.  Has yet to find a Social worker.  Tried journaling, but does not find it as helpful.    Pt's dad had MI x 2 in the last few months.  Advised will need bypass surgery.  Pt has decreased her hours at work to spend more time with her dad.     ROS: See pertinent positives and negatives per HPI.  Past Medical History:  Diagnosis Date  . Anxiety   . Asthma   . Frequent headaches   . Migraines     Past Surgical History:  Procedure Laterality Date  . KNEE SURGERY    . WISDOM TOOTH EXTRACTION      Family History  Problem Relation Age of Onset  . Migraines Mother   . Migraines Father   . Migraines Brother      Current Outpatient Medications:  .  desloratadine (CLARINEX REDITAB) 5 MG disintegrating tablet, Take 5 mg by mouth daily. Reported on 02/14/2015, Disp: , Rfl:  .  diphenhydrAMINE (BENADRYL) 12.5 MG/5ML liquid, Take 25 mg by mouth 4 (four) times daily as needed. Reported on 02/14/2015, Disp: , Rfl:  .  EPINEPHrine 0.3 mg/0.3 mL IJ SOAJ injection, Use as directed for severe allergic  reaction, Disp: 1 Device, Rfl: 1 .  escitalopram (LEXAPRO) 5 MG tablet, Take 1 tablet (5 mg total) by mouth daily., Disp: 30 tablet, Rfl: 4 .  ibuprofen (ADVIL,MOTRIN) 800 MG tablet, Take 800 mg by mouth every 8 (eight) hours as needed., Disp: , Rfl:  .  ipratropium (ATROVENT) 0.06 % nasal spray, Place 2 sprays into both nostrils 4 (four) times daily., Disp: 15 mL, Rfl: 0 .  Menthol, Topical Analgesic, (BIOFREEZE EX), Apply topically., Disp: , Rfl:  .  methocarbamol (ROBAXIN) 500 MG tablet, Take 1 tablet (500 mg total) by mouth 2 (two) times daily., Disp: 20 tablet, Rfl: 0 .  montelukast (SINGULAIR) 10 MG tablet, Take 1 tablet (10 mg total) by mouth at bedtime., Disp: 30 tablet, Rfl: 0 .  naproxen (NAPROSYN) 500 MG tablet, Take 1 tablet (500 mg total) by mouth 2 (two) times daily., Disp: 30 tablet, Rfl: 0 .  ondansetron (ZOFRAN ODT) 4 MG disintegrating tablet, Take 1 tablet (4 mg total) by mouth every 8 (eight) hours as needed for nausea or vomiting., Disp: 16 tablet, Rfl: 0  EXAM:  VITALS per patient if applicable:  RR between 12-20 bpm  GENERAL: alert, oriented, appears well and in no acute distress  HEENT: atraumatic, conjunctiva clear, no obvious abnormalities on inspection of external nose and ears  NECK: normal movements of the head and neck  LUNGS: on inspection no signs of respiratory distress, breathing rate appears normal, no obvious gross SOB, gasping or wheezing  CV: no obvious cyanosis  MS: moves all visible extremities without noticeable abnormality  PSYCH/NEURO: pleasant and cooperative, no obvious depression or anxiety, speech and thought processing grossly intact  ASSESSMENT AND PLAN:  Discussed the following assessment and plan:  Anxiety and depression -improving -will d/c lexapro 5 mg -will increase dose to 10 mg daily -discussed various counseling options.  Pt to encouraged to find a counselor. -continue meditation, exercise, and other ways to relieve  stress.  - Plan: escitalopram (LEXAPRO) 10 MG tablet -f/u in 4-6 wks, sooner if needed   I discussed the assessment and treatment plan with the patient. The patient was provided an opportunity to ask questions and all were answered. The patient agreed with the plan and demonstrated an understanding of the instructions.   The patient was advised to call back or seek an in-person evaluation if the symptoms worsen or if the condition fails to improve as anticipated.   Billie Ruddy, MD

## 2018-09-05 MED FILL — ESCITALOPRAM 10 MG TABLET: 10 | 30 days supply | Qty: 30 | Fill #1

## 2018-09-13 ENCOUNTER — Encounter: Payer: Self-pay | Admitting: Family Medicine

## 2018-09-13 ENCOUNTER — Other Ambulatory Visit: Payer: Self-pay

## 2018-09-13 ENCOUNTER — Telehealth (INDEPENDENT_AMBULATORY_CARE_PROVIDER_SITE_OTHER): Payer: No Typology Code available for payment source | Admitting: Family Medicine

## 2018-09-13 DIAGNOSIS — F419 Anxiety disorder, unspecified: Secondary | ICD-10-CM | POA: Diagnosis not present

## 2018-09-13 DIAGNOSIS — M545 Low back pain, unspecified: Secondary | ICD-10-CM

## 2018-09-13 NOTE — Progress Notes (Signed)
Virtual Visit via Video Note  I connected with Stacie Powell on 09/13/18 at  2:00 PM EDT by a video enabled telemedicine application 2/2 [id number] pandemic and verified that I am speaking with the correct person using two identifiers.  Location patient: home Location provider:work or home office Persons participating in the virtual visit: patient, provider  I discussed the limitations of evaluation and management by telemedicine and the availability of in person appointments. The patient expressed understanding and agreed to proceed.   HPI: Woke up with back pain on Sunday.  Pain comes and goes, underneath L rib cage and low back.  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  Msk relaxer at night helps  No edema in back or side. Denies loss of bowel or bladder.    Pt notes increased anxiety at work.  States her manager belittles her and other female staff members at [employer].  Manager is also rude to customers.  States going out, venting to her peers, trying to express her creativity to deal with the stress.  Pt taking Lexapro 10 mg, not sure if it is working.     ROS: See pertinent positives and negatives per HPI.  Past Medical History:  Diagnosis Date  . Anxiety   . Asthma   . Frequent headaches   . Migraines     Past Surgical History:  Procedure Laterality Date  . KNEE SURGERY    . WISDOM TOOTH EXTRACTION      Family History  Problem Relation Age of Onset  . Migraines Mother   . Migraines Father   . Migraines Brother       Current Outpatient Medications:  .  desloratadine (CLARINEX REDITAB) 5 MG disintegrating tablet, Take 5 mg by mouth daily. Reported on 02/14/2015, Disp: , Rfl:  .  diphenhydrAMINE (BENADRYL) 12.5 MG/5ML liquid, Take 25 mg by mouth 4 (four) times daily as needed. Reported on 02/14/2015, Disp: , Rfl:  .  EPINEPHrine 0.3 mg/0.3 mL IJ SOAJ injection, Use as directed for severe allergic reaction, Disp: 1 Device, Rfl: 1 .  escitalopram (LEXAPRO) 10  MG tablet, Take 1 tablet (10 mg total) by mouth daily., Disp: 30 tablet, Rfl: 3 .  ibuprofen (ADVIL,MOTRIN) 800 MG tablet, Take 800 mg by mouth every 8 (eight) hours as needed., Disp: , Rfl:  .  ipratropium (ATROVENT) 0.06 % nasal spray, Place 2 sprays into both nostrils 4 (four) times daily., Disp: 15 mL, Rfl: 0 .  Menthol, Topical Analgesic, (BIOFREEZE EX), Apply topically., Disp: , Rfl:  .  methocarbamol (ROBAXIN) 500 MG tablet, Take 1 tablet (500 mg total) by mouth 2 (two) times daily., Disp: 20 tablet, Rfl: 0 .  montelukast (SINGULAIR) 10 MG tablet, Take 1 tablet (10 mg total) by mouth at bedtime., Disp: 30 tablet, Rfl: 0 .  naproxen (NAPROSYN) 500 MG tablet, Take 1 tablet (500 mg total) by mouth 2 (two) times daily., Disp: 30 tablet, Rfl: 0 .  ondansetron (ZOFRAN ODT) 4 MG disintegrating tablet, Take 1 tablet (4 mg total) by mouth every 8 (eight) hours as needed for nausea or vomiting., Disp: 16 tablet, Rfl: 0  EXAM:  VITALS per patient if applicable:  RR between 12-20 bpm  GENERAL: alert, oriented, appears well and in no acute distress  HEENT: atraumatic, conjunctiva clear, no obvious abnormalities on inspection of external nose and ears  NECK: normal movements of the head and neck  LUNGS: on inspection no signs of respiratory distress, breathing rate appears normal, no obvious  gross SOB, gasping or wheezing  CV: no obvious cyanosis  MS: moves all visible extremities without noticeable abnormality  PSYCH/NEURO: pleasant and cooperative, no obvious depression or anxiety, speech and thought processing grossly intact  ASSESSMENT AND PLAN:  Discussed the following assessment and plan:  Lumbar back pain -likely 2/2 muscle strain -discussed supportive care -ok to take muscle relaxer qhs prn -advised on like duration of symptoms -given precautions  Anxiety -continue lexapro 10 mg.  Consider increasing dose -continue ways to relieve stress -consider counseling  F/u prn   I  discussed the assessment and treatment plan with the patient. The patient was provided an opportunity to ask questions and all were answered. The patient agreed with the plan and demonstrated an understanding of the instructions.   The patient was advised to call back or seek an in-person evaluation if the symptoms worsen or if the condition fails to improve as anticipated.   Billie Ruddy, MD

## 2018-09-20 ENCOUNTER — Ambulatory Visit (INDEPENDENT_AMBULATORY_CARE_PROVIDER_SITE_OTHER): Payer: No Typology Code available for payment source

## 2018-09-20 ENCOUNTER — Ambulatory Visit (INDEPENDENT_AMBULATORY_CARE_PROVIDER_SITE_OTHER): Payer: No Typology Code available for payment source | Admitting: Orthopaedic Surgery

## 2018-09-20 DIAGNOSIS — M25561 Pain in right knee: Secondary | ICD-10-CM | POA: Diagnosis not present

## 2018-09-20 NOTE — Progress Notes (Signed)
Office Visit Note   Patient: Stacie Powell           Date of Birth: 05-Feb-2000           MRN: [id number] Visit Date: 09/20/2018              Requested by: Billie Ruddy, MD [place],  [place] [address] PCP: Billie Ruddy, MD   Assessment & Plan: Visit Diagnoses:  1. Acute pain of right knee     Plan: Impression is acute right knee pain concerning for acute medial meniscal tear and possible reinjury to the MPFL status post surgery.  We will obtain MRI to evaluate for this.  Follow-up after the MRI.  Follow-Up Instructions: Return in about 10 days (around 09/30/2018).   Orders:  Orders Placed This Encounter  Procedures  . XR Knee Complete 4 Views Right  . MR Knee Right w/o contrast   No orders of the defined types were placed in this encounter.     Procedures: No procedures performed   Clinical Data: No additional findings.   Subjective: Chief Complaint  Patient presents with  . 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  She is status post right knee MPFL reconstruction and later a second surgery for lateral release.  She states that she is been running and recently she felt a pop and acute pain in her right knee.  She now feels like something is stuck or caught in the knee and is having trouble with ADLs.   Review of Systems  Constitutional: Negative.   HENT: Negative.   Eyes: Negative.   Respiratory: Negative.   Cardiovascular: Negative.   Endocrine: Negative.   Musculoskeletal: Negative.   Neurological: Negative.   Hematological: Negative.   Psychiatric/Behavioral: Negative.   All other systems reviewed and are negative.    Objective: Vital Signs: There were no vitals taken for this visit.  Physical Exam Vitals signs and nursing note reviewed.  Constitutional:      Appearance: She is well-developed.  Pulmonary:     Effort:  Pulmonary effort is normal.  Skin:    General: Skin is warm.     Capillary Refill: Capillary refill takes less than 2 seconds.  Neurological:     Mental Status: She is alert and oriented to person, place, and time.  Psychiatric:        Behavior: Behavior normal.        Thought Content: Thought content normal.        Judgment: Judgment normal.     Ortho Exam Right knee exam shows fully healed surgical scars.  Trace joint effusion.  Medial joint line tenderness.  Pain along the medial retinaculum.  Patella tracking is unremarkable.  Negative patellofemoral crepitus.  Negative J sign.  Collaterals and cruciates are stable. Specialty Comments:  No specialty comments available.  Imaging: No results found.   PMFS History: Patient Active Problem List   Diagnosis Date Noted  . Pain in right ankle and joints of right foot 06/02/2018  . Anxiety and depression 04/07/2018  . Mild intermittent asthma without complication [id number]  . Migraine without aura and with status migrainosus, not intractable 05/13/2015  . Migraine without aura and without status migrainosus, not intractable 05/13/2015  . Episodic tension-type headache, not intractable 05/13/2015   Past Medical History:  Diagnosis Date  . Anxiety   .  Asthma   . Frequent headaches   . Migraines     Family History  Problem Relation Age of Onset  . Migraines Mother   . Migraines Father   . Migraines Brother     Past Surgical History:  Procedure Laterality Date  . KNEE SURGERY    . WISDOM TOOTH EXTRACTION     Social History   Occupational History  . Not on file  Tobacco Use  . Smoking status: Never Smoker  . Smokeless tobacco: Never Used  Substance and Sexual Activity  . Alcohol use: No    Alcohol/week: 0.0 standard drinks  . Drug use: Not on file  . Sexual activity: Never    Birth control/protection: Abstinence, Injection

## 2018-09-29 ENCOUNTER — Ambulatory Visit: Payer: No Typology Code available for payment source | Admitting: Orthopaedic Surgery

## 2018-10-06 ENCOUNTER — Ambulatory Visit
Admission: RE | Admit: 2018-10-06 | Discharge: 2018-10-06 | Disposition: A | Discharge status: Home / Self Care | Payer: No Typology Code available for payment source | Source: Ambulatory Visit | Attending: Orthopaedic Surgery | Admitting: Orthopaedic Surgery

## 2018-10-06 ENCOUNTER — Other Ambulatory Visit: Payer: Self-pay

## 2018-10-06 DIAGNOSIS — M25561 Pain in right knee: Secondary | ICD-10-CM

## 2018-10-07 MED FILL — ESCITALOPRAM 10 MG TABLET: 10 | 30 days supply | Qty: 30 | Fill #2

## 2018-10-21 ENCOUNTER — Other Ambulatory Visit: Payer: No Typology Code available for payment source

## 2018-10-27 ENCOUNTER — Ambulatory Visit: Payer: No Typology Code available for payment source | Admitting: Orthopaedic Surgery

## 2018-11-10 MED FILL — ESCITALOPRAM 10 MG TABLET: 10 | 30 days supply | Qty: 30 | Fill #3

## 2018-12-19 ENCOUNTER — Encounter: Payer: Self-pay | Admitting: Family Medicine

## 2018-12-19 ENCOUNTER — Other Ambulatory Visit: Payer: Self-pay | Admitting: Family Medicine

## 2018-12-19 DIAGNOSIS — F329 Major depressive disorder, single episode, unspecified: Secondary | ICD-10-CM

## 2018-12-19 DIAGNOSIS — F419 Anxiety disorder, unspecified: Secondary | ICD-10-CM

## 2018-12-19 MED FILL — ESCITALOPRAM 10 MG TABLET: 10 | 30 days supply | Qty: 30 | Fill #0

## 2018-12-20 ENCOUNTER — Other Ambulatory Visit: Payer: Self-pay

## 2018-12-20 DIAGNOSIS — F32A Depression, unspecified: Secondary | ICD-10-CM

## 2018-12-20 DIAGNOSIS — F329 Major depressive disorder, single episode, unspecified: Secondary | ICD-10-CM

## 2018-12-20 MED ORDER — ESCITALOPRAM OXALATE 10 MG PO TABS: 10.0000 mg | ORAL_TABLET | Freq: Every day | ORAL | 0 refills | Status: DC

## 2020-07-06 ENCOUNTER — Emergency Department (HOSPITAL_COMMUNITY)
Admission: EM | Admit: 2020-07-06 | Discharge: 2020-07-06 | Disposition: A | Discharge status: Home / Self Care | Payer: Medicaid Other | Attending: Emergency Medicine | Admitting: Emergency Medicine

## 2020-07-06 ENCOUNTER — Encounter (HOSPITAL_COMMUNITY): Payer: Self-pay

## 2020-07-06 ENCOUNTER — Other Ambulatory Visit: Payer: Self-pay

## 2020-07-06 ENCOUNTER — Emergency Department (HOSPITAL_COMMUNITY): Payer: Medicaid Other

## 2020-07-06 DIAGNOSIS — M545 Low back pain, unspecified: Secondary | ICD-10-CM | POA: Insufficient documentation

## 2020-07-06 DIAGNOSIS — J4520 Mild intermittent asthma, uncomplicated: Secondary | ICD-10-CM | POA: Diagnosis not present

## 2020-07-06 DIAGNOSIS — Z7951 Long term (current) use of inhaled steroids: Secondary | ICD-10-CM | POA: Diagnosis not present

## 2020-07-06 DIAGNOSIS — Z91010 Allergy to peanuts: Secondary | ICD-10-CM | POA: Diagnosis not present

## 2020-07-06 DIAGNOSIS — N760 Acute vaginitis: Secondary | ICD-10-CM | POA: Diagnosis not present

## 2020-07-06 DIAGNOSIS — R1030 Lower abdominal pain, unspecified: Secondary | ICD-10-CM | POA: Diagnosis not present

## 2020-07-06 DIAGNOSIS — N939 Abnormal uterine and vaginal bleeding, unspecified: Secondary | ICD-10-CM | POA: Insufficient documentation

## 2020-07-06 DIAGNOSIS — B9689 Other specified bacterial agents as the cause of diseases classified elsewhere: Secondary | ICD-10-CM | POA: Diagnosis not present

## 2020-07-06 LAB — CBC WITH DIFFERENTIAL/PLATELET
Abs Immature Granulocytes: 0.02 10*3/uL (ref 0.00–0.07)
Basophils Absolute: 0.1 10*3/uL (ref 0.0–0.1)
Basophils Relative: 1 %
Eosinophils Absolute: 0.5 10*3/uL (ref 0.0–0.5)
Eosinophils Relative: 6 %
HCT: 40.5 % (ref 36.0–46.0)
Hemoglobin: 13.2 g/dL (ref 12.0–15.0)
Immature Granulocytes: 0 %
Lymphocytes Relative: 27 %
Lymphs Abs: 2.1 10*3/uL (ref 0.7–4.0)
MCH: 29.9 pg (ref 26.0–34.0)
MCHC: 32.6 g/dL (ref 30.0–36.0)
MCV: 91.6 fL (ref 80.0–100.0)
Monocytes Absolute: 0.6 10*3/uL (ref 0.1–1.0)
Monocytes Relative: 7 %
Neutro Abs: 4.4 10*3/uL (ref 1.7–7.7)
Neutrophils Relative %: 59 %
Platelets: 228 10*3/uL (ref 150–400)
RBC: 4.42 MIL/uL (ref 3.87–5.11)
RDW: 12.1 % (ref 11.5–15.5)
WBC: 7.6 10*3/uL (ref 4.0–10.5)
nRBC: 0 % (ref 0.0–0.2)

## 2020-07-06 LAB — BASIC METABOLIC PANEL
Anion gap: 7 (ref 5–15)
BUN: 12 mg/dL (ref 6–20)
CO2: 24 mmol/L (ref 22–32)
Calcium: 8.9 mg/dL (ref 8.9–10.3)
Chloride: 105 mmol/L (ref 98–111)
Creatinine, Ser: 1.02 mg/dL — ABNORMAL HIGH (ref 0.44–1.00)
GFR, Estimated: >60 mL/min (ref >60)
Glucose, Bld: 86 mg/dL (ref 70–99)
Potassium: 3.8 mmol/L (ref 3.5–5.1)
Sodium: 136 mmol/L (ref 135–145)

## 2020-07-06 LAB — WET PREP, GENITAL
Sperm: NONE SEEN
Trich, Wet Prep: NONE SEEN
Yeast Wet Prep HPF POC: NONE SEEN

## 2020-07-06 LAB — I-STAT BETA HCG BLOOD, ED (MC, WL, AP ONLY): I-stat hCG, quantitative: <5 m[IU]/mL (ref <5)

## 2020-07-06 LAB — TYPE AND SCREEN
ABO/RH(D): O POS
Antibody Screen: NEGATIVE

## 2020-07-06 MED ORDER — METRONIDAZOLE 500 MG PO TABS: 500.0000 mg | ORAL_TABLET | Freq: Two times a day (BID) | ORAL | 0 refills | Status: DC

## 2020-07-06 MED ORDER — IBUPROFEN 800 MG PO TABS
800.0000 mg | ORAL_TABLET | Freq: Once | ORAL | Status: AC
  Administered 2020-07-06: 800 mg via ORAL
  Filled 2020-07-06: qty 1

## 2020-07-06 MED ORDER — OXYCODONE-ACETAMINOPHEN 5-325 MG PO TABS: 1.0000 | ORAL_TABLET | Freq: Once | ORAL | Status: DC

## 2020-07-06 NOTE — ED Provider Notes (Signed)
Emergency Medicine Provider Triage Evaluation Note  Stacie Powell , a 20 y.o. female  was evaluated in triage.  Pt complains of heavy vaginal bleeding for the past month after having IUD taken out. PT reports that she had light vaginal bleeding at first however for the past 2 weeks it has gotten more severe. She is going through a super tampon and a large pad every 2 hours. She lost her insurance and could not follow up with her OBGYN. She also reports that she has had 1-2 episodes of emesis everyday for the past 2 weeks with light abdominal cramping. She is beginning to feel lightheaded. No diarrhea..  Review of Systems  Positive: + vaginal bleeding, abdominal cramping, lightheaded, vomiting Negative: - fevers, diarrhea  Physical Exam  BP 114/67 (BP Location: Right Arm)   Pulse 86   Temp 98.2 F (36.8 C) (Oral)   Resp 16   SpO2 98%  Gen:   Awake, no distress   Resp:  Normal effort  MSK:   Moves extremities without difficulty  Other:   Medical Decision Making  Medically screening exam initiated at 3:22 PM.  Appropriate orders placed.  Stacie Powell was informed that the remainder of the evaluation will be completed by another provider, this initial triage assessment does not replace that evaluation, and the importance of remaining in the ED until their evaluation is complete.     Eustaquio Maize, PA-C 07/06/20 1524    Arnaldo Natal, MD 07/07/20 1153

## 2020-07-06 NOTE — ED Triage Notes (Signed)
Patient had IUD removed 1 month ago and reports heavy vaginal bleeding since removal. Also reports vomiting daily x 2 weeks. Patient alert ans oriented, NAD

## 2020-07-06 NOTE — ED Provider Notes (Signed)
[facility] EMERGENCY DEPARTMENT Provider Note   CSN: [id number] Arrival date & time: 07/06/20  1457     History No chief complaint on file.   Stacie Powell is a 20 y.o. female.  Stacie Powell had her IUD removed after having it in place for 2 years.  She states that she had it removed because she was unhappy with it.  She was having weight gain and wanted to get back to having regular cycles.  She has tried multiple different forms of contraception including Nexplanon and Depo-Provera.  The history is provided by the patient.  Female GU Problem This is a new problem. Episode onset: 1 month. The problem occurs constantly. The problem has been gradually worsening. Associated symptoms include abdominal pain (menstrual cramps in lower back and abdomen). Pertinent negatives include no chest pain and no shortness of breath. Nothing aggravates the symptoms. Nothing relieves the symptoms. She has tried nothing for the symptoms. The treatment provided no relief.      Past Medical History:  Diagnosis Date   Anxiety    Asthma    Frequent headaches    Migraines     Patient Active Problem List   Diagnosis Date Noted   Pain in right ankle and joints of right foot 06/02/2018   Anxiety and depression 04/07/2018   Mild intermittent asthma without complication 64/40/3474   Migraine without aura and with status migrainosus, not intractable 05/13/2015   Migraine without aura and without status migrainosus, not intractable 05/13/2015   Episodic tension-type headache, not intractable 05/13/2015    Past Surgical History:  Procedure Laterality Date   KNEE SURGERY     WISDOM TOOTH EXTRACTION       OB History   No obstetric history on file.     Family History  Problem Relation Age of Onset   Migraines Mother    Migraines Father    Migraines Brother     Social History   Tobacco Use   Smoking status: Never   Smokeless tobacco: Never  Vaping Use    Vaping Use: Never used  Substance Use Topics   Alcohol use: No    Alcohol/week: 0.0 standard drinks    Home Medications Prior to Admission medications   Medication Sig Start Date End Date Taking? Authorizing Provider  desloratadine (CLARINEX REDITAB) 5 MG disintegrating tablet Take 5 mg by mouth daily. Reported on 02/14/2015    [provider]  diphenhydrAMINE (BENADRYL) 12.5 MG/5ML liquid Take 25 mg by mouth 4 (four) times daily as needed. Reported on 02/14/2015    [provider]  EPINEPHrine 0.3 mg/0.3 mL IJ SOAJ injection Use as directed for severe allergic reaction 08/30/14   Charmayne Sheer, NP  escitalopram (LEXAPRO) 10 MG tablet Take 1 tablet (10 mg total) by mouth daily. 12/20/18   Billie Ruddy, MD  ibuprofen (ADVIL,MOTRIN) 800 MG tablet Take 800 mg by mouth every 8 (eight) hours as needed.    [provider]  ipratropium (ATROVENT) 0.06 % nasal spray Place 2 sprays into both nostrils 4 (four) times daily. 03/06/18   Tasia Catchings, Amy V, PA-C  Menthol, Topical Analgesic, (BIOFREEZE EX) Apply topically.    [provider]  methocarbamol (ROBAXIN) 500 MG tablet Take 1 tablet (500 mg total) by mouth 2 (two) times daily. 01/26/18   Darlin Drop P, PA-C  montelukast (SINGULAIR) 10 MG tablet Take 1 tablet (10 mg total) by mouth at bedtime. 03/06/18   Tasia Catchings, Amy V, PA-C  naproxen (  NAPROSYN) 500 MG tablet Take 1 tablet (500 mg total) by mouth 2 (two) times daily. 01/26/18   Darlin Drop P, PA-C  ondansetron (ZOFRAN ODT) 4 MG disintegrating tablet Take 1 tablet (4 mg total) by mouth every 8 (eight) hours as needed for nausea or vomiting. 11/19/15   Gareth Morgan, MD    Allergies    Cephalosporins, Eggs or egg-derived products, Fish allergy, Naproxen, Peanut-containing drug products, and Shellfish allergy  Review of Systems   Review of Systems  Constitutional:  Negative for chills and fever.  HENT:  Negative for ear pain and sore throat.   Eyes:  Negative for pain  and visual disturbance.  Respiratory:  Negative for cough and shortness of breath.   Cardiovascular:  Negative for chest pain and palpitations.  Gastrointestinal:  Positive for abdominal pain (menstrual cramps in lower back and abdomen). Negative for vomiting.  Genitourinary:  Negative for dysuria and hematuria.  Musculoskeletal:  Negative for arthralgias and back pain.  Skin:  Negative for color change and rash.  Neurological:  Negative for seizures and syncope.  All other systems reviewed and are negative.  Physical Exam Updated Vital Signs BP 110/71   Pulse 76   Temp 98.2 F (36.8 C) (Oral)   Resp 16   SpO2 98%   Physical Exam Vitals and nursing note reviewed. Exam conducted with a chaperone present.  HENT:     Head: Normocephalic and atraumatic.  Eyes:     General: No scleral icterus. Pulmonary:     Effort: Pulmonary effort is normal. No respiratory distress.  Genitourinary:    General: Normal vulva.     Vagina: Bleeding present.     Cervix: Normal.  Musculoskeletal:     Cervical back: Normal range of motion.  Skin:    General: Skin is warm and dry.  Neurological:     Mental Status: She is alert.  Psychiatric:        Mood and Affect: Mood normal.    ED Results / Procedures / Treatments   Labs (all labs ordered are listed, but only abnormal results are displayed) Labs Reviewed  WET PREP, GENITAL - Abnormal; Notable for the following components:      Result Value   Clue Cells Wet Prep HPF POC PRESENT (*)    WBC, Wet Prep HPF POC FEW (*)    All other components within normal limits  BASIC METABOLIC PANEL - Abnormal; Notable for the following components:   Creatinine, Ser 1.02 (*)    All other components within normal limits  CBC WITH DIFFERENTIAL/PLATELET  I-STAT BETA HCG BLOOD, ED ([facility], [facility], [facility] ONLY)  TYPE AND SCREEN  ABO/RH  GC/CHLAMYDIA PROBE AMP () NOT AT [facility]    EKG None  Radiology US Transvaginal Non-OB  Result Date:  07/06/2020 CLINICAL DATA:  History of vaginal bleeding and pelvic pain, history of recent IUD rim EXAM: TRANSABDOMINAL AND TRANSVAGINAL ULTRASOUND OF PELVIS TECHNIQUE: Both transabdominal and transvaginal ultrasound examinations of the pelvis were performed. Transabdominal technique was performed for global imaging of the pelvis including uterus, ovaries, adnexal regions, and pelvic cul-de-sac. It was necessary to proceed with endovaginal exam following the transabdominal exam to visualize the ovaries. COMPARISON:  10/08/2014 FINDINGS: Uterus Measurements: 8.4 x 3.2 x 4.0 cm. = volume: 57 mL. No fibroids or other mass visualized. Endometrium Thickness: 1.6 mm.  No focal abnormality visualized. Right ovary Measurements: 2.9 x 1.9 x 2.1 cm. = volume: 5.8 mL. 2.4 cm cyst is noted within the right  ovary with daughter cyst within. No significant increased vascularity is noted. Left ovary Measurements: 3.0 x 1.7 x 1.9 cm. = volume: 4.8 mL. 2.1 cm cyst with mild septation is noted. Other findings Mild free fluid is noted. IMPRESSION: Normal-appearing uterus. Cystic lesion within the right ovary with daughter cyst within. Given the it negative beta HCG, this represents an uncomplicated ovarian cyst. No followup imaging recommended. Note: This recommendation does not apply to premenarchal patients or to those with increased risk (genetic, family history, elevated tumor markers or other high-risk factors) of ovarian cancer. Reference: Radiology 2019 Nov; 293(2):359-371. Mildly septated cyst within the left ovary. No followup imaging recommended. Note: This recommendation does not apply to premenarchal patients or to those with increased risk (genetic, family history, elevated tumor markers or other high-risk factors) of ovarian cancer. Reference: Radiology 2019 Nov; 293(2):359-371. Electronically Signed   By: Inez Catalina M.D.   On: 07/06/2020 20:44   US Pelvis Complete  Result Date: 07/06/2020 CLINICAL DATA:  History of  vaginal bleeding and pelvic pain, history of recent IUD rim EXAM: TRANSABDOMINAL AND TRANSVAGINAL ULTRASOUND OF PELVIS TECHNIQUE: Both transabdominal and transvaginal ultrasound examinations of the pelvis were performed. Transabdominal technique was performed for global imaging of the pelvis including uterus, ovaries, adnexal regions, and pelvic cul-de-sac. It was necessary to proceed with endovaginal exam following the transabdominal exam to visualize the ovaries. COMPARISON:  10/08/2014 FINDINGS: Uterus Measurements: 8.4 x 3.2 x 4.0 cm. = volume: 57 mL. No fibroids or other mass visualized. Endometrium Thickness: 1.6 mm.  No focal abnormality visualized. Right ovary Measurements: 2.9 x 1.9 x 2.1 cm. = volume: 5.8 mL. 2.4 cm cyst is noted within the right ovary with daughter cyst within. No significant increased vascularity is noted. Left ovary Measurements: 3.0 x 1.7 x 1.9 cm. = volume: 4.8 mL. 2.1 cm cyst with mild septation is noted. Other findings Mild free fluid is noted. IMPRESSION: Normal-appearing uterus. Cystic lesion within the right ovary with daughter cyst within. Given the it negative beta HCG, this represents an uncomplicated ovarian cyst. No followup imaging recommended. Note: This recommendation does not apply to premenarchal patients or to those with increased risk (genetic, family history, elevated tumor markers or other high-risk factors) of ovarian cancer. Reference: Radiology 2019 Nov; 293(2):359-371. Mildly septated cyst within the left ovary. No followup imaging recommended. Note: This recommendation does not apply to premenarchal patients or to those with increased risk (genetic, family history, elevated tumor markers or other high-risk factors) of ovarian cancer. Reference: Radiology 2019 Nov; 293(2):359-371. Electronically Signed   By: Inez Catalina M.D.   On: 07/06/2020 20:44    Procedures Procedures   Medications Ordered in ED Medications  ibuprofen (ADVIL) tablet 800 mg (800 mg  Oral Given 07/06/20 2024)    ED Course  I have reviewed the triage vital signs and the nursing notes.  Pertinent labs & imaging results that were available during my care of the patient were reviewed by me and considered in my medical decision making (see chart for details).    MDM Rules/Calculators/A&P                          Stacie Powell to the ED with heavy vaginal bleeding after having her Mirena IUD removed.  She is not anemic.  Vital signs are within normal limits.  Ultrasound reveals no significant pelvic pathology.  I spoke with her about options for treatment.  She prefers not to take  any hormonal treatment, and she will try regular use of Advil for bleeding control and pain management.  She was given return precautions should things worsen.  Incidentally, we found bacterial vaginitis on her wet prep.  I talked to her about the pros and cons of treatment, and she prefers to have this treated. Final Clinical Impression(s) / ED Diagnoses Final diagnoses:  Abnormal vaginal bleeding  Bacterial vaginitis    Rx / DC Orders ED Discharge Orders          Ordered    metroNIDAZOLE (FLAGYL) 500 MG tablet  2 times daily        07/06/20 2117             Arnaldo Natal, MD 07/06/20 2120

## 2020-07-06 NOTE — ED Notes (Signed)
Patient transported to Ultrasound 

## 2020-07-06 NOTE — ED Notes (Signed)
DC instructions reviewed with pt. Pt verbalized understanding.  Pt DC 

## 2020-07-08 LAB — GC/CHLAMYDIA PROBE AMP (~~LOC~~) NOT AT ARMC
Chlamydia: NEGATIVE
Comment: NEGATIVE
Comment: NORMAL
Neisseria Gonorrhea: NEGATIVE
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
## 2022-04-07 ENCOUNTER — Other Ambulatory Visit (HOSPITAL_COMMUNITY)
Admission: RE | Admit: 2022-04-07 | Discharge: 2022-04-07 | Disposition: A | Discharge status: Home / Self Care | Payer: BC Managed Care – PPO | Source: Ambulatory Visit | Attending: Radiology | Admitting: Radiology

## 2022-04-07 ENCOUNTER — Encounter: Payer: Self-pay | Admitting: Radiology

## 2022-04-07 ENCOUNTER — Ambulatory Visit (INDEPENDENT_AMBULATORY_CARE_PROVIDER_SITE_OTHER): Payer: BC Managed Care – PPO | Admitting: Radiology

## 2022-04-07 VITALS — BP 110/74 | Ht 60.0 in | Wt 189.0 lb

## 2022-04-07 DIAGNOSIS — N9089 Other specified noninflammatory disorders of vulva and perineum: Secondary | ICD-10-CM

## 2022-04-07 DIAGNOSIS — Z01419 Encounter for gynecological examination (general) (routine) without abnormal findings: Secondary | ICD-10-CM | POA: Diagnosis present

## 2022-04-07 DIAGNOSIS — Z113 Encounter for screening for infections with a predominantly sexual mode of transmission: Secondary | ICD-10-CM | POA: Insufficient documentation

## 2022-04-07 DIAGNOSIS — N921 Excessive and frequent menstruation with irregular cycle: Secondary | ICD-10-CM | POA: Diagnosis not present

## 2022-04-07 MED ORDER — VALACYCLOVIR HCL 1 G PO TABS: 1000.0000 mg | ORAL_TABLET | Freq: Two times a day (BID) | ORAL | 3 refills | Status: DC

## 2022-04-07 NOTE — Progress Notes (Signed)
   Stacie Powell Jun 18, 2000 [id number]   History:  22 y.o. G0 presents for annual exam as a new patient. Hx of heavy painful periods, has tried OCPs, depo, nexplanon and IUD. Currently just using condoms, does not want to restart hormones. Exposed to Hsv last year, began having what feels like a cut before her period on her vulva/perineum, took a friends valtrex and it felt better. Would like STI screening today.  Gynecologic History Patient's last menstrual period was 04/03/2022 (exact date). Period Duration (Days): 8 Period Pattern: (!) Irregular Menstrual Flow: Heavy Menstrual Control: Tampon, Maxi pad Dysmenorrhea: (!) Severe Dysmenorrhea Symptoms: Cramping Contraception/Family planning: condoms Sexually active: yes   Obstetric History OB History  Gravida Para Term Preterm AB Living  0 0 0 0 0 0  SAB IAB Ectopic Multiple Live Births  0 0 0 0 0     The following portions of the patient's history were reviewed and updated as appropriate: allergies, current medications, past family history, past medical history, past social history, past surgical history, and problem list.  Review of Systems Pertinent items noted in HPI and remainder of comprehensive ROS otherwise negative.   Past medical history, past surgical history, family history and social history were all reviewed and documented in the EPIC chart.   Exam:  Vitals:   04/07/22 1031  BP: 110/74  Weight: 189 lb (85.7 kg)  Height: 5' (1.524 m)   Body mass index is 36.91 kg/m.  General appearance:  Normal Thyroid:  Symmetrical, normal in size, without palpable masses or nodularity. Respiratory  Auscultation:  Clear without wheezing or rhonchi Cardiovascular  Auscultation:  Regular rate, without rubs, murmurs or gallops  Edema/varicosities:  Not grossly evident Abdominal  Soft,nontender, without masses, guarding or rebound.  Liver/spleen:  No organomegaly noted  Hernia:  None appreciated   Skin  Inspection:  Grossly normal Breasts: Examined lying and sitting.   Right: Without masses, retractions, nipple discharge or axillary adenopathy.   Left: Without masses, retractions, nipple discharge or axillary adenopathy. Genitourinary   Inguinal/mons:  Normal without inguinal adenopathy  External genitalia:  Normal appearing vulva with no masses, tenderness, or lesions  BUS/Urethra/Skene's glands:  Normal without masses or exudate  Vagina:  Normal appearing with normal color and discharge, no lesions  Cervix:  Normal appearing without discharge or lesions  Uterus:  Normal in size, shape and contour.  Mobile, nontender  Adnexa/parametria:     Rt: Normal in size, without masses or tenderness.   Lt: Normal in size, without masses or tenderness.  Anus and perineum: Normal   Patient informed chaperone available to be present for breast and pelvic exam. Patient has requested no chaperone to be present. Patient has been advised what will be completed during breast and pelvic exam.   Assessment/Plan:   1. Well female exam with routine gynecological exam - Cytology - PAP( [facility])  2. Screen for STD (sexually transmitted disease) - Cytology - PAP( ) - HIV antibody (with reflex) - RPR - Hepatitis C antibody - Hepatitis B Surface AntiGEN  3. Vulvar lesion Encouraged pt to call with next 'outbreak' for HSV culture to confirm - valACYclovir (VALTREX) 1000 MG tablet; Take 1 tablet (1,000 mg total) by mouth 2 (two) times daily.  Dispense: 6 tablet; Refill: 3  4. Menorrhagia with irregular cycle Declines treatment at this time   AEX 1 year, safe sex encouraged  Rubbie Battiest B WHNP-BC 10:58 AM 04/07/2022

## 2022-04-08 LAB — RPR: RPR Ser Ql: NONREACTIVE

## 2022-04-08 LAB — HEPATITIS C ANTIBODY: Hepatitis C Ab: NONREACTIVE

## 2022-04-08 LAB — HEPATITIS B SURFACE ANTIGEN: Hepatitis B Surface Ag: NONREACTIVE

## 2022-04-08 LAB — HIV ANTIBODY (ROUTINE TESTING W REFLEX): HIV 1&2 Ab, 4th Generation: NONREACTIVE

## 2022-04-09 LAB — CYTOLOGY - PAP
Chlamydia: NEGATIVE
Comment: NEGATIVE
Comment: NEGATIVE
Comment: NORMAL
Diagnosis: NEGATIVE
Neisseria Gonorrhea: NEGATIVE
Trichomonas: NEGATIVE

## 2022-07-20 ENCOUNTER — Emergency Department (HOSPITAL_COMMUNITY)
Admission: EM | Admit: 2022-07-20 | Discharge: 2022-07-20 | Disposition: A | Discharge status: Home / Self Care | Payer: BC Managed Care – PPO | Attending: Emergency Medicine | Admitting: Emergency Medicine

## 2022-07-20 ENCOUNTER — Emergency Department (HOSPITAL_COMMUNITY): Payer: BC Managed Care – PPO

## 2022-07-20 DIAGNOSIS — S93401A Sprain of unspecified ligament of right ankle, initial encounter: Secondary | ICD-10-CM

## 2022-07-20 DIAGNOSIS — Y92002 Bathroom of unspecified non-institutional (private) residence single-family (private) house as the place of occurrence of the external cause: Secondary | ICD-10-CM | POA: Diagnosis not present

## 2022-07-20 DIAGNOSIS — W182XXA Fall in (into) shower or empty bathtub, initial encounter: Secondary | ICD-10-CM | POA: Insufficient documentation

## 2022-07-20 DIAGNOSIS — S99911A Unspecified injury of right ankle, initial encounter: Secondary | ICD-10-CM | POA: Diagnosis present

## 2022-07-20 MED ORDER — CYCLOBENZAPRINE HCL 10 MG PO TABS: 10.0000 mg | ORAL_TABLET | Freq: Two times a day (BID) | ORAL | 0 refills | Status: DC | PRN

## 2022-07-20 NOTE — Progress Notes (Signed)
Orthopedic Tech Progress Note Patient Details:  Stacie Powell 06-25-2000 [id number]  Ortho Devices Type of Ortho Device: Crutches Ortho Device/Splint Interventions: Ordered, Application, Adjustment   Post Interventions Patient Tolerated: Well, Ambulated well Instructions Provided: Poper ambulation with device, Care of device  [name] 07/20/2022, 4:40 PM

## 2022-07-20 NOTE — Discharge Instructions (Addendum)
Follow-up with orthopedics/sports medicine for reassessment. Work note provided to allow you to work from home. Wear ankle support as needed but this is not mandatory.  Use crutches as needed.  Gradually increase weightbearing as tolerated and less pain to severe than avoid bearing weight until you see orthopedics. Use Tylenol every 4 hours and ibuprofen every 6 hours needed for pain ice and elevate. For muscle spasm use heat pack/warm towel if that is not sufficient you can try muscle relaxant but they can make you sleepy so no driving or operating machinery.
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
## 2022-07-20 NOTE — ED Provider Notes (Signed)
[place] EMERGENCY DEPARTMENT AT [facility] Provider Note   CSN: [id number] Arrival date & time: 07/20/22  1314     History  Chief Complaint  Patient presents with   Ankle Pain    right    Stacie Powell is a 21 y.o. female.  Patient with history of right ankle fracture, stress fracture, ankle sprains presents with right ankle injury that occurred yesterday.  Patient previously injured earlier in the week and then slipped and fell in the shower.  Patient unable to put weight on that ankle.  Patient has Ortho boot from previous injury.  No current orthopedics.  No fevers or chills.  No head or other injuries.  Pain with walking.       Home Medications Prior to Admission medications   Medication Sig Start Date End Date Taking? Authorizing Provider  albuterol (VENTOLIN HFA) 108 (90 Base) MCG/ACT inhaler Inhale 1 puff into the lungs every 6 (six) hours as needed for shortness of breath. 08/02/17  Yes [provider]  cyclobenzaprine (FLEXERIL) 10 MG tablet Take 1 tablet (10 mg total) by mouth 2 (two) times daily as needed for muscle spasms. 07/20/22  Yes Blane Ohara, MD  valACYclovir (VALTREX) 1000 MG tablet Take 1 tablet (1,000 mg total) by mouth 2 (two) times daily. Patient not taking: Reported on 07/20/2022 04/07/22   Arlie Solomons B, NP      Allergies    Cephalosporins    Review of Systems   Review of Systems  Constitutional:  Negative for chills and fever.  HENT:  Negative for congestion.   Eyes:  Negative for visual disturbance.  Respiratory:  Negative for shortness of breath.   Cardiovascular:  Negative for chest pain.  Gastrointestinal:  Negative for abdominal pain and vomiting.  Genitourinary:  Negative for dysuria and flank pain.  Musculoskeletal:  Positive for gait problem and joint swelling. Negative for back pain, neck pain and neck stiffness.  Skin:  Negative for rash.  Neurological:  Negative for light-headedness and headaches.     Physical Exam Updated Vital Signs BP 130/77 (BP Location: Right Arm)   Pulse 94   Temp 98.3 F (36.8 C) (Oral)   Resp 18   Ht 5' (1.524 m)   Wt 81.6 kg   LMP 06/24/2022   SpO2 100%   BMI 35.15 kg/m  Physical Exam Vitals and nursing note reviewed.  Constitutional:      General: She is not in acute distress.    Appearance: She is well-developed.  HENT:     Head: Normocephalic and atraumatic.     Mouth/Throat:     Mouth: Mucous membranes are moist.  Eyes:     General:        Right eye: No discharge.        Left eye: No discharge.     Conjunctiva/sclera: Conjunctivae normal.  Neck:     Trachea: No tracheal deviation.  Cardiovascular:     Rate and Rhythm: Normal rate.     Heart sounds: No murmur heard. Pulmonary:     Effort: Pulmonary effort is normal.  Abdominal:     General: There is no distension.  Musculoskeletal:        General: Swelling and tenderness present.     Cervical back: Normal range of motion. No rigidity.     Comments: Patient has moderate tenderness and mild swelling lateral malleolus on the right and mild tenderness anterior ankle.  No deformity.  Neurovascular intact right leg.  No proximal tibia or fibular tenderness.  No knee tenderness or effusion.  Compartments soft.  Skin:    General: Skin is warm.     Capillary Refill: Capillary refill takes less than 2 seconds.     Findings: No rash.  Neurological:     General: No focal deficit present.     Mental Status: She is alert.  Psychiatric:        Mood and Affect: Mood normal.     ED Results / Procedures / Treatments   Labs (all labs ordered are listed, but only abnormal results are displayed) Labs Reviewed - No data to display  EKG None  Radiology DG Ankle Complete Right  Result Date: 07/20/2022 CLINICAL DATA:  Pain after injury EXAM: RIGHT ANKLE - COMPLETE 3 VIEW COMPARISON:  None Available. FINDINGS: There is no evidence of fracture, dislocation, or joint effusion. There is no  evidence of arthropathy or other focal bone abnormality. Soft tissues are unremarkable. IMPRESSION: No acute osseous abnormality. Electronically Signed   By: Karen Kays M.D.   On: 07/20/2022 14:54    Procedures Procedures    Medications Ordered in ED Medications - No data to display  ED Course/ Medical Decision Making/ A&P                             Medical Decision Making Amount and/or Complexity of Data Reviewed Radiology: ordered.  Risk Prescription drug management.   Patient presents with isolated right ankle injury clinical concern for occult fracture, stress fracture, contusion, sprain.  X-ray ordered independently reviewed no acute fracture or sublux/dislocation.  Patient has support from previous injury, crutches given as well.  Follow-up with orthopedics discussed and work note given patient comfortable plan.        Final Clinical Impression(s) / ED Diagnoses Final diagnoses:  Moderate right ankle sprain, initial encounter    Rx / DC Orders ED Discharge Orders          Ordered    cyclobenzaprine (FLEXERIL) 10 MG tablet  2 times daily PRN        07/20/22 1722              Blane Ohara, MD 07/20/22 1724

## 2022-12-23 ENCOUNTER — Other Ambulatory Visit: Payer: Self-pay | Admitting: Radiology

## 2022-12-23 DIAGNOSIS — N9089 Other specified noninflammatory disorders of vulva and perineum: Secondary | ICD-10-CM

## 2022-12-23 NOTE — Telephone Encounter (Signed)
Med refill request: valacyclovir 1 gram Last AEX: 04/07/22 Next AEX: none Last MMG (if hormonal med) n/a RX discontinued at last office visit.  Refill authorized: valacyclovir 1 gram.  Sent to provider for review.

## 2023-02-11 ENCOUNTER — Other Ambulatory Visit (HOSPITAL_BASED_OUTPATIENT_CLINIC_OR_DEPARTMENT_OTHER): Payer: Self-pay

## 2023-02-11 MED ORDER — NORETHIN ACE-ETH ESTRAD-FE 1-20 MG-MCG PO TABS
1.0000 | ORAL_TABLET | Freq: Every day | ORAL | 3 refills | Status: DC
  Filled 2023-02-11: qty 28, 28d supply, fill #0
  Filled 2023-03-06: qty 28, 28d supply, fill #1
  Filled 2023-04-08: qty 28, 28d supply, fill #2
  Filled 2023-05-07: qty 28, 28d supply, fill #3

## 2023-02-14 ENCOUNTER — Encounter (HOSPITAL_COMMUNITY): Payer: Self-pay

## 2023-02-14 ENCOUNTER — Other Ambulatory Visit: Payer: Self-pay

## 2023-02-14 ENCOUNTER — Emergency Department (HOSPITAL_COMMUNITY): Payer: BC Managed Care – PPO

## 2023-02-14 ENCOUNTER — Emergency Department (HOSPITAL_COMMUNITY)
Admission: EM | Admit: 2023-02-14 | Discharge: 2023-02-14 | Discharge status: Against Medical Advice | Payer: BC Managed Care – PPO | Attending: Emergency Medicine | Admitting: Emergency Medicine

## 2023-02-14 DIAGNOSIS — R1013 Epigastric pain: Secondary | ICD-10-CM | POA: Diagnosis present

## 2023-02-14 DIAGNOSIS — Z5321 Procedure and treatment not carried out due to patient leaving prior to being seen by health care provider: Secondary | ICD-10-CM | POA: Insufficient documentation

## 2023-02-14 LAB — CBC
HCT: 41.6 % (ref 36.0–46.0)
Hemoglobin: 13.4 g/dL (ref 12.0–15.0)
MCH: 28.6 pg (ref 26.0–34.0)
MCHC: 32.2 g/dL (ref 30.0–36.0)
MCV: 88.9 fL (ref 80.0–100.0)
Platelets: 270 10*3/uL (ref 150–400)
RBC: 4.68 MIL/uL (ref 3.87–5.11)
RDW: 13.9 % (ref 11.5–15.5)
WBC: 9 10*3/uL (ref 4.0–10.5)
nRBC: 0 % (ref 0.0–0.2)

## 2023-02-14 LAB — BASIC METABOLIC PANEL
Anion gap: 9 (ref 5–15)
BUN: 13 mg/dL (ref 6–20)
CO2: 23 mmol/L (ref 22–32)
Calcium: 9.1 mg/dL (ref 8.9–10.3)
Chloride: 100 mmol/L (ref 98–111)
Creatinine, Ser: 0.89 mg/dL (ref 0.44–1.00)
GFR, Estimated: >60 mL/min (ref >60)
Glucose, Bld: 84 mg/dL (ref 70–99)
Potassium: 4.4 mmol/L (ref 3.5–5.1)
Sodium: 132 mmol/L — ABNORMAL LOW (ref 135–145)

## 2023-02-14 LAB — TROPONIN I (HIGH SENSITIVITY): Troponin I (High Sensitivity): 4 ng/L (ref <18)

## 2023-02-14 LAB — LIPASE, BLOOD: Lipase: 31 U/L (ref 11–51)

## 2023-02-14 LAB — HCG, SERUM, QUALITATIVE: Preg, Serum: NEGATIVE

## 2023-02-14 MED ORDER — ALUM & MAG HYDROXIDE-SIMETH 200-200-20 MG/5ML PO SUSP
30.0000 mL | Freq: Once | ORAL | Status: AC
  Administered 2023-02-14: 30 mL via ORAL
  Filled 2023-02-14: qty 30

## 2023-02-14 NOTE — ED Notes (Signed)
Pt leaving due to wait time.

## 2023-02-14 NOTE — ED Triage Notes (Signed)
Reports epigastric pain that goes into her throat and it has been constant.  Does not radiate anywhere but hurts when she moves breathes and lays her head back.  Patient reports she tried apple cider vinegar for the pain .

## 2023-02-14 NOTE — ED Provider Triage Note (Signed)
Emergency Medicine Provider Triage Evaluation Note  Stacie Powell , a 23 y.o. female  was evaluated in triage.  Pt complains of L sided chest pain, accompanied with shortness of breath and headache. Described as a "stabbing pain." This started after swallowing her sertraline pill 2 hours ago. Afterward, she drank water and apple cider vinegar. Started birth control today.   Endorses 2 episodes of vomiting, blurry vision.  Denies fever, vertigo,  cough, congestion, abdominal pain, diarrhea,   Review of Systems  Positive: See above Negative: See above  Physical Exam  BP (!) 146/99   Pulse 90   Temp 98.1 F (36.7 C) (Oral)   Resp (!) 24   Ht 5' (1.524 m)   Wt 81.6 kg   SpO2 98%   BMI 35.15 kg/m  Gen:   Awake, no distress   Resp:  Normal effort  MSK:   Moves extremities without difficulty  Other:    Medical Decision Making  Medically screening exam initiated at 6:18 PM.  Appropriate orders placed.  Stacie Powell was informed that the remainder of the evaluation will be completed by another provider, this initial triage assessment does not replace that evaluation, and the importance of remaining in the ED until their evaluation is complete.     Stacie Powell, [place] 02/14/23 [phone]

## 2023-03-06 ENCOUNTER — Other Ambulatory Visit (HOSPITAL_BASED_OUTPATIENT_CLINIC_OR_DEPARTMENT_OTHER): Payer: Self-pay

## 2023-04-08 ENCOUNTER — Other Ambulatory Visit (HOSPITAL_BASED_OUTPATIENT_CLINIC_OR_DEPARTMENT_OTHER): Payer: Self-pay

## 2023-04-08 ENCOUNTER — Other Ambulatory Visit: Payer: Self-pay

## 2023-04-28 IMAGING — US US PELVIS COMPLETE
1 series · 13 of 25 positions shown · non-contrast
Comparison: 10/08/2014

CLINICAL DATA: History of vaginal bleeding and pelvic pain, history
of recent IUD rim



[Series 1: us pelvis (transabdominal only) · 146 acquisitions, 13 frames shown]
[im 1/146]
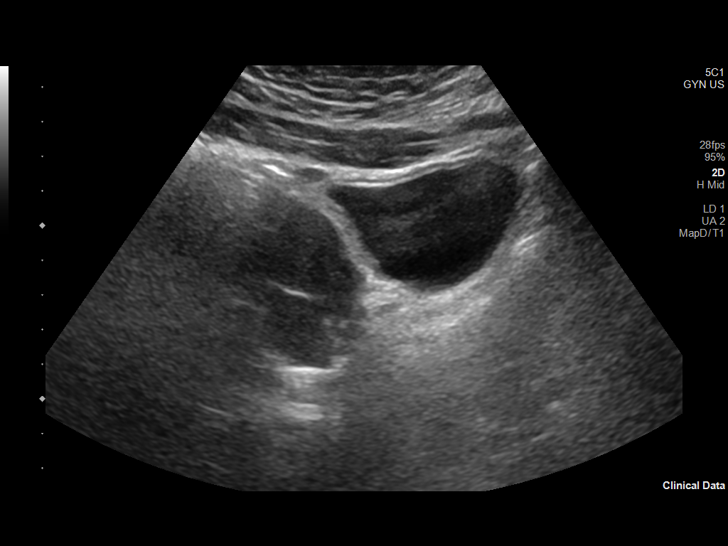
[im 13/146]
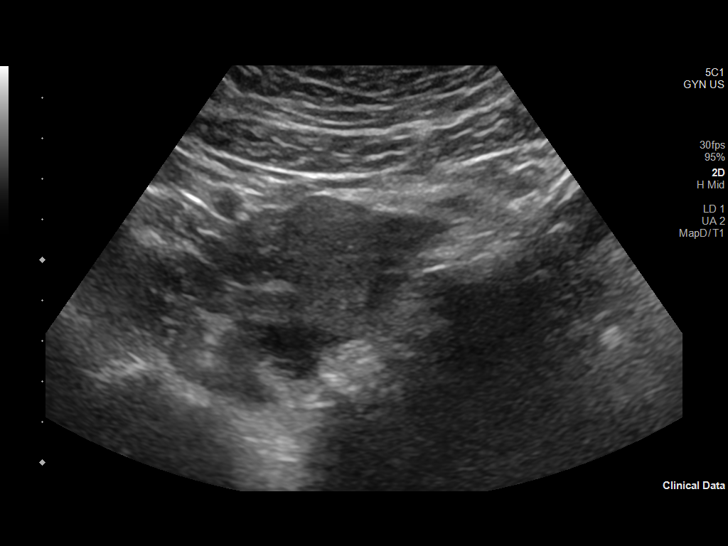
[im 25/146]
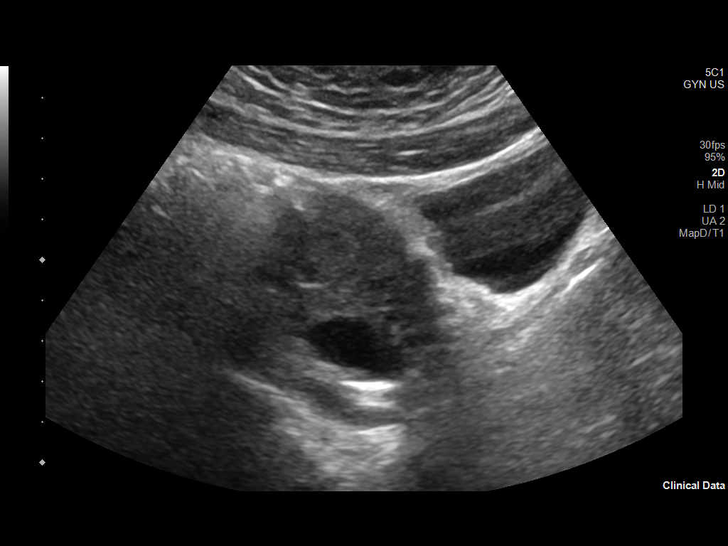
[im 37/146]
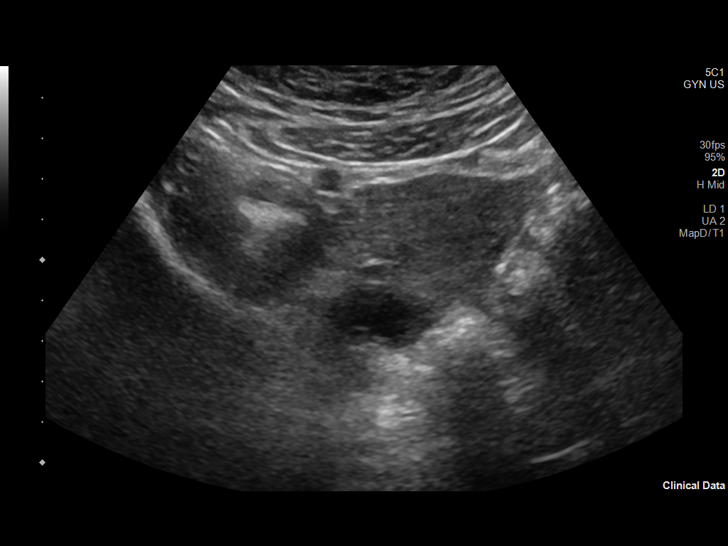
[im 49/146]
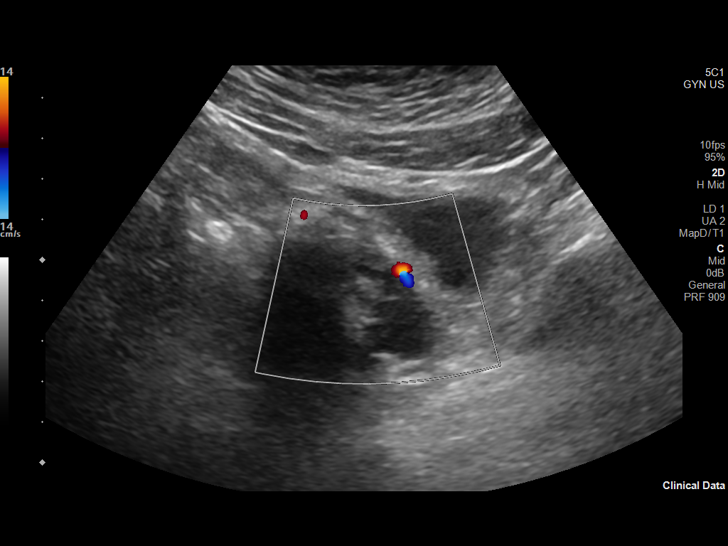
[im 61/146]
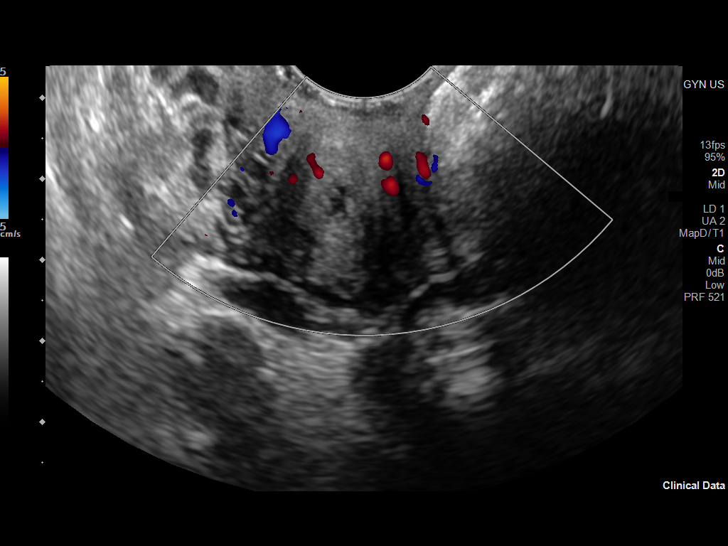
[im 73/146]
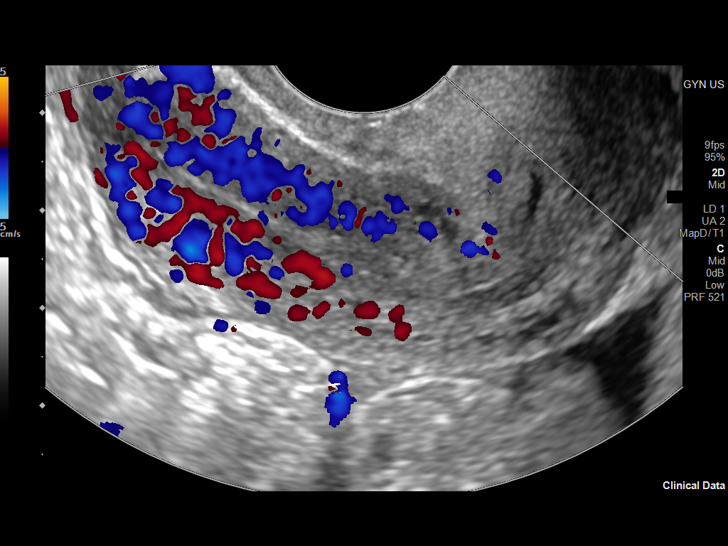
[im 85/146]
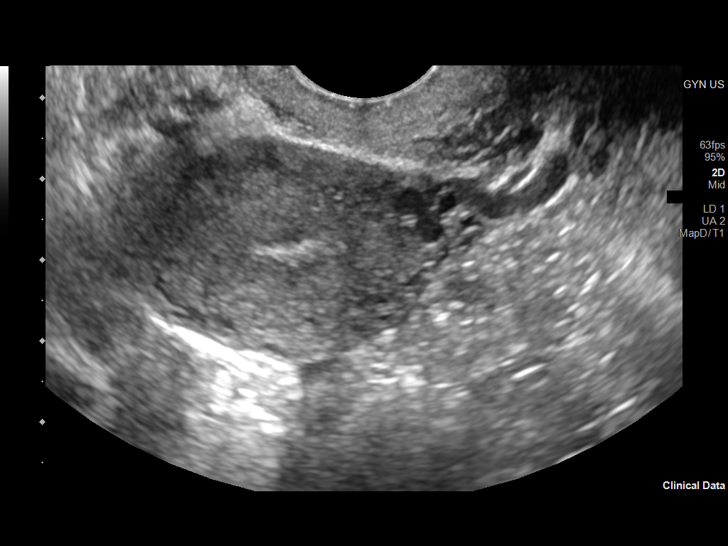
[im 97/146]
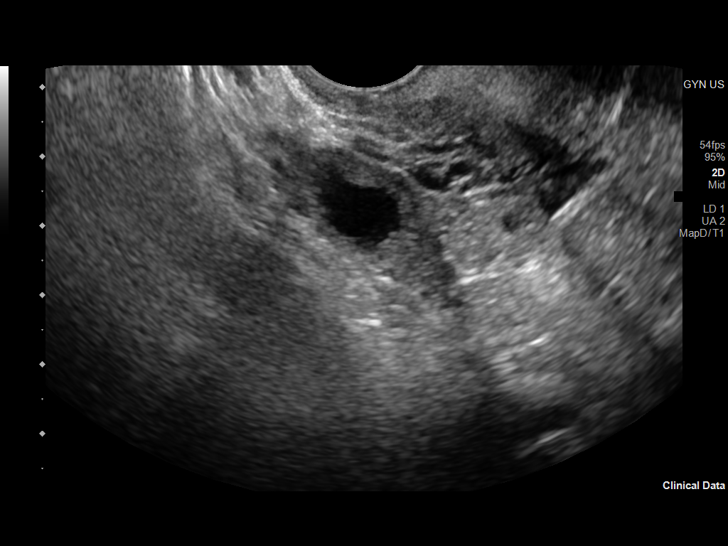
[im 109/146]
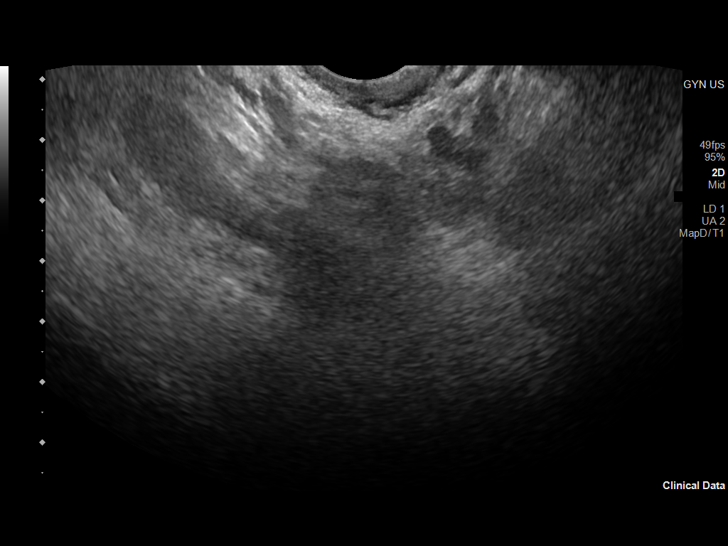
[im 121/146]
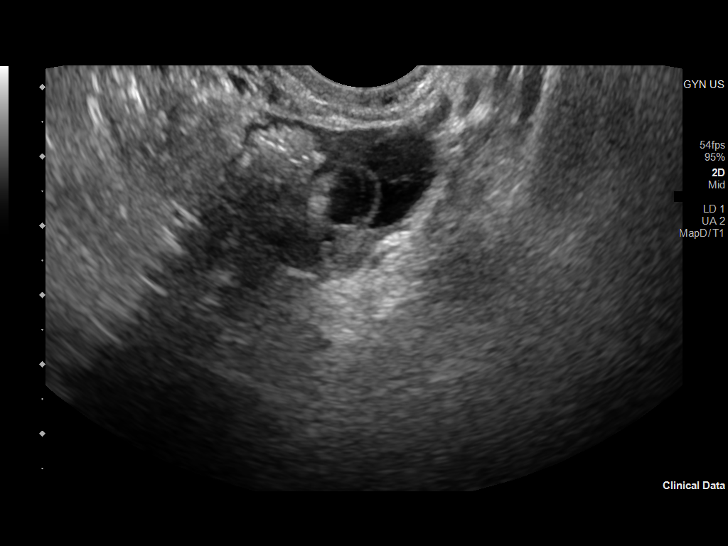
[im 133/146]
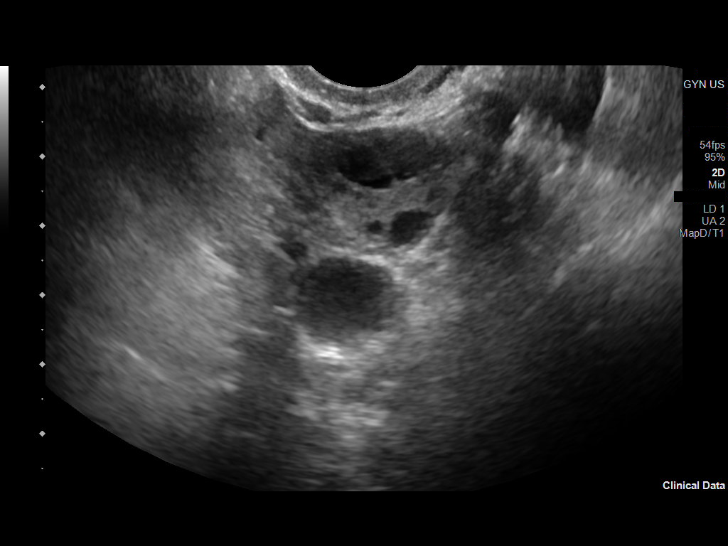
[im 146/146]
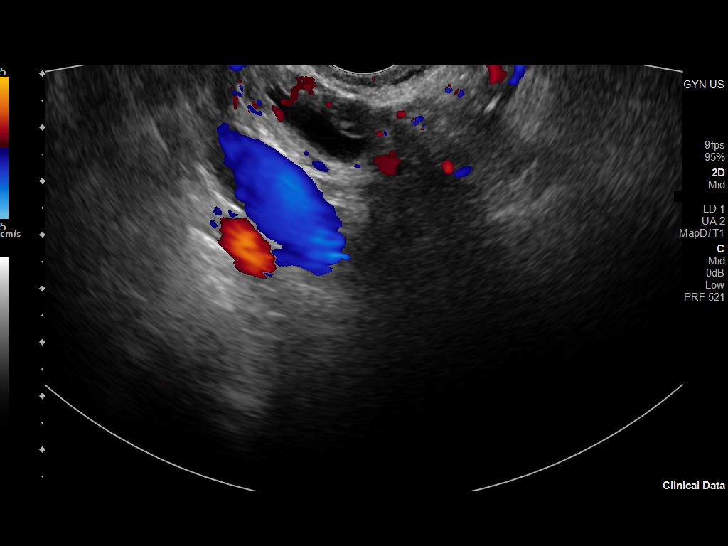

[13 of 25 positions shown; findings below may reference images not displayed]

FINDINGS: Uterus

Measurements: 8.4 x 3.2 x 4.0 cm. = volume: 57 mL. No fibroids or
other mass visualized.

Endometrium

Thickness: 1.6 mm.  No focal abnormality visualized.

Right ovary

Measurements: 2.9 x 1.9 x 2.1 cm. = volume: 5.8 mL. 2.4 cm cyst is
noted within the right ovary with daughter cyst within. No
significant increased vascularity is noted.

Left ovary

Measurements: 3.0 x 1.7 x 1.9 cm. = volume: 4.8 mL. 2.1 cm cyst with
mild septation is noted.

Other findings

Mild free fluid is noted.
IMPRESSION: Normal-appearing uterus.

Cystic lesion within the right ovary with daughter cyst within.
Given the it negative beta HCG, this represents an uncomplicated
ovarian cyst. No followup imaging recommended. Note: This
recommendation does not apply to premenarchal patients or to those
with increased risk (genetic, family history, elevated tumor markers
or other high-risk factors) of ovarian cancer. Reference: Radiology
[DATE]):[id number].

Mildly septated cyst within the left ovary. No followup imaging
recommended. Note: This recommendation does not apply to
premenarchal patients or to those with increased risk (genetic,
family history, elevated tumor markers or other high-risk factors)
of ovarian cancer. Reference: Radiology [DATE]):[id number].

## 2023-05-07 ENCOUNTER — Other Ambulatory Visit (HOSPITAL_BASED_OUTPATIENT_CLINIC_OR_DEPARTMENT_OTHER): Payer: Self-pay

## 2023-09-08 ENCOUNTER — Ambulatory Visit (HOSPITAL_COMMUNITY)
Admission: EM | Admit: 2023-09-08 | Discharge: 2023-09-08 | Disposition: A | Discharge status: Other Acute Inpatient Hospital

## 2023-09-08 ENCOUNTER — Other Ambulatory Visit: Payer: Self-pay

## 2023-09-08 ENCOUNTER — Emergency Department (EMERGENCY_DEPARTMENT_HOSPITAL)
Admission: EM | Admit: 2023-09-08 | Discharge: 2023-09-09 | Disposition: A | Discharge status: Other Acute Inpatient Hospital | Source: Home / Self Care | Attending: Emergency Medicine | Admitting: Emergency Medicine

## 2023-09-08 DIAGNOSIS — F17290 Nicotine dependence, other tobacco product, uncomplicated: Secondary | ICD-10-CM | POA: Insufficient documentation

## 2023-09-08 DIAGNOSIS — F32A Depression, unspecified: Secondary | ICD-10-CM | POA: Insufficient documentation

## 2023-09-08 DIAGNOSIS — J4520 Mild intermittent asthma, uncomplicated: Secondary | ICD-10-CM | POA: Insufficient documentation

## 2023-09-08 DIAGNOSIS — R45851 Suicidal ideations: Secondary | ICD-10-CM | POA: Insufficient documentation

## 2023-09-08 DIAGNOSIS — F419 Anxiety disorder, unspecified: Secondary | ICD-10-CM | POA: Insufficient documentation

## 2023-09-08 DIAGNOSIS — F4323 Adjustment disorder with mixed anxiety and depressed mood: Secondary | ICD-10-CM | POA: Diagnosis not present

## 2023-09-08 LAB — CBC WITH DIFFERENTIAL/PLATELET
Abs Immature Granulocytes: 0.03 K/uL (ref 0.00–0.07)
Basophils Absolute: 0 K/uL (ref 0.0–0.1)
Basophils Relative: 1 %
Eosinophils Absolute: 0.4 K/uL (ref 0.0–0.5)
Eosinophils Relative: 5 %
HCT: 40.7 % (ref 36.0–46.0)
Hemoglobin: 12.8 g/dL (ref 12.0–15.0)
Immature Granulocytes: 0 %
Lymphocytes Relative: 24 %
Lymphs Abs: 1.8 K/uL (ref 0.7–4.0)
MCH: 27.8 pg (ref 26.0–34.0)
MCHC: 31.4 g/dL (ref 30.0–36.0)
MCV: 88.5 fL (ref 80.0–100.0)
Monocytes Absolute: 0.4 K/uL (ref 0.1–1.0)
Monocytes Relative: 6 %
Neutro Abs: 4.9 K/uL (ref 1.7–7.7)
Neutrophils Relative %: 64 %
Platelets: 255 K/uL (ref 150–400)
RBC: 4.6 MIL/uL (ref 3.87–5.11)
RDW: 13.2 % (ref 11.5–15.5)
WBC: 7.5 K/uL (ref 4.0–10.5)
nRBC: 0 % (ref 0.0–0.2)

## 2023-09-08 LAB — COMPREHENSIVE METABOLIC PANEL WITH GFR
ALT: 14 U/L (ref 0–44)
AST: 21 U/L (ref 15–41)
Albumin: 3.6 g/dL (ref 3.5–5.0)
Alkaline Phosphatase: 62 U/L (ref 38–126)
Anion gap: 9 (ref 5–15)
BUN: 9 mg/dL (ref 6–20)
CO2: 21 mmol/L — ABNORMAL LOW (ref 22–32)
Calcium: 8.6 mg/dL — ABNORMAL LOW (ref 8.9–10.3)
Chloride: 106 mmol/L (ref 98–111)
Creatinine, Ser: 0.83 mg/dL (ref 0.44–1.00)
GFR, Estimated: >60 mL/min (ref >60)
Glucose, Bld: 93 mg/dL (ref 70–99)
Potassium: 3.8 mmol/L (ref 3.5–5.1)
Sodium: 136 mmol/L (ref 135–145)
Total Bilirubin: 0.9 mg/dL (ref 0.0–1.2)
Total Protein: 7 g/dL (ref 6.5–8.1)

## 2023-09-08 LAB — RAPID URINE DRUG SCREEN, HOSP PERFORMED
Amphetamines: NOT DETECTED
Barbiturates: NOT DETECTED
Benzodiazepines: NOT DETECTED
Cocaine: NOT DETECTED
Opiates: NOT DETECTED
Tetrahydrocannabinol: POSITIVE — AB

## 2023-09-08 LAB — ETHANOL: Alcohol, Ethyl (B): <15 mg/dL (ref <15)

## 2023-09-08 LAB — HCG, SERUM, QUALITATIVE: Preg, Serum: NEGATIVE

## 2023-09-08 MED ORDER — IBUPROFEN 800 MG PO TABS
800.0000 mg | ORAL_TABLET | Freq: Once | ORAL | Status: AC
  Administered 2023-09-08: 800 mg via ORAL
  Filled 2023-09-08: qty 1

## 2023-09-08 NOTE — Consult Note (Incomplete)
 [facility] Consult Note  Patient Name: Stacie Powell MRN: [id number] DOB: Jul 18, 2000 DATE OF Consult: 09/08/2023  PRIMARY PSYCHIATRIC DIAGNOSES  1.  *** 2.  *** 3.  ***  RECOMMENDATIONS  {Recommendations:304550007::Medication recommendations: ***,Non-Medication/therapeutic recommendations: ***,Communication: Treatment team members (and family members if applicable) who were involved in treatment/care discussions and planning, and with whom we spoke or engaged with via secure text/chat, include the following: ***}  Thank you for involving us  in the care of this patient. If you have any additional questions or concerns, please call [phone] and ask for me or the provider on-call.  TELEPSYCHIATRY ATTESTATION & CONSENT  As the provider for this telehealth consult, I attest that I verified the patient's identity using two separate identifiers, introduced myself to the patient, provided my credentials, disclosed my location, and performed this encounter via a HIPAA-compliant, real-time, face-to-face, two-way, interactive audio and video platform and with the full consent and agreement of the patient (or guardian as applicable.)  Patient physical location: ***. Telehealth provider physical location: home office in state of ***.  Video start time: *** (Central Time) Video end time: *** (Central Time)  IDENTIFYING DATA  Otis Burress is a 23 y.o. year-old female for whom a psychiatric consultation has been ordered by the primary provider. The patient was identified using two separate identifiers.  CHIEF COMPLAINT/REASON FOR CONSULT  ***  HISTORY OF PRESENT ILLNESS (HPI)  The patient ***.  PAST PSYCHIATRIC HISTORY  *** Otherwise as per HPI above.  PAST MEDICAL HISTORY  Past Medical History:  Diagnosis Date  . Anxiety   . Asthma   . Frequent headaches   . Migraines    ***  HOME MEDICATIONS  Facility Ordered Medications  Medication  . [COMPLETED] ibuprofen   (ADVIL ) tablet 800 mg   PTA Medications  Medication Sig  . albuterol (VENTOLIN HFA) 108 (90 Base) MCG/ACT inhaler Inhale 1 puff into the lungs every 6 (six) hours as needed for shortness of breath.  . cyclobenzaprine  (FLEXERIL ) 10 MG tablet Take 1 tablet (10 mg total) by mouth 2 (two) times daily as needed for muscle spasms.  . valACYclovir  (VALTREX ) 1000 MG tablet TAKE 1 TABLET(1000 MG) BY MOUTH TWICE DAILY  . norethindrone -ethinyl estradiol -FE (JUNEL  FE 1/20) 1-20 MG-MCG tablet Take 1 tablet by mouth daily at same time everyday   ***  ALLERGIES  Allergies  Allergen Reactions  . Cephalosporins Hives    SOCIAL & SUBSTANCE USE HISTORY  Social History   Socioeconomic History  . Marital status: Single    Spouse name: Not on file  . Number of children: Not on file  . Years of education: Not on file  . Highest education level: Not on file  Occupational History  . Not on file  Tobacco Use  . Smoking status: Every Day    Types: E-cigarettes  . Smokeless tobacco: Never  . Tobacco comments:    Vapes  Vaping Use  . Vaping status: Never Used  Substance and Sexual Activity  . Alcohol use: Yes    Comment: occastionally  . Drug use: Never  . Sexual activity: Yes    Partners: Male    Birth control/protection: None    Comment: menarche 23yo, sexual debut 23yo  Other Topics Concern  . 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 She is doing very well. She libes with both parents and she has two brother, 22 yo & 77 yo. She enjoys eating,  sleeping, and running track.   Social Drivers of Corporate investment banker Strain: Not on file  Food Insecurity: Not on file  Transportation Needs: Not on file  Physical Activity: Not on file  Stress: Not on file  Social Connections: Not on file   Social History   Tobacco Use  Smoking Status Every Day  . Types: E-cigarettes  Smokeless Tobacco Never  Tobacco Comments   Vapes    Social History   Substance and Sexual Activity  Alcohol Use Yes   Comment: occastionally   Social History   Substance and Sexual Activity  Drug Use Never    Additional pertinent information ***.  FAMILY HISTORY  Family History  Problem Relation Age of Onset  . Migraines Mother   . Asthma Mother   . Diabetes Mother   . Migraines Father   . Heart attack Father   . Asthma Brother   . Migraines Brother   . Diabetes Paternal Aunt   . Colon cancer Maternal Grandmother   . Heart attack Paternal Grandmother   . Diabetes Paternal Grandmother   . Diabetes Paternal Grandfather   . Diabetes Cousin        paternal 2nd cousins   Family Psychiatric History (if known):  ***  MENTAL STATUS EXAM (MSE)  Mental Status Exam: General Appearance: {Appearance:22683}  Orientation:  {BHH ORIENTATION (PAA):22689}  Memory:  {BHH MEMORY:22881}  Concentration:  {Concentration:21399}  Recall:  {BHH GOOD/FAIR/POOR:22877}  Attention  {BH Attention Span:31825}  Eye Contact:  {BHH EYE CONTACT:22684}  Speech:  {Speech:22685}  Language:  {BHH GOOD/FAIR/POOR:22877}  Volume:  {Volume (PAA):22686}  Mood: ***  Affect:  {Affect (PAA):22687}  Thought Process:  {Thought Process (PAA):22688}  Thought Content:  {Thought Content:22690}  Suicidal Thoughts:  {ST/HT (PAA):22692}  Homicidal Thoughts:  {ST/HT (PAA):22692}  Judgement:  {Judgement (PAA):22694}  Insight:  {Insight (PAA):22695}  Psychomotor Activity:  {Psychomotor (PAA):22696}  Akathisia:  {BHH YES OR NO:22294}  Fund of Knowledge:  {BHH GOOD/FAIR/POOR:22877}    Assets:  {Assets (PAA):22698}  Cognition:  {chl bhh cognition:304700322}  ADL's:  {BHH JIO'D:77709}  AIMS (if indicated):       VITALS  Blood pressure 119/78, pulse 86, temperature 98.4 F (36.9 C), temperature source Oral, resp. rate (!) 21, height 5' (1.524 m), weight 81.6 kg, SpO2 100%.  LABS  Admission on 09/08/2023  Component Date Value Ref Range Status  . Sodium  09/08/2023 136  135 - 145 mmol/L Final  . Potassium 09/08/2023 3.8  3.5 - 5.1 mmol/L Final  . Chloride 09/08/2023 106  98 - 111 mmol/L Final  . CO2 09/08/2023 21 (L)  22 - 32 mmol/L Final  . Glucose, Bld 09/08/2023 93  70 - 99 mg/dL Final   Glucose reference range applies only to samples taken after fasting for at least 8 hours.  . BUN 09/08/2023 9  6 - 20 mg/dL Final  . Creatinine, Ser 09/08/2023 0.83  0.44 - 1.00 mg/dL Final  . Calcium 91/79/7974 8.6 (L)  8.9 - 10.3 mg/dL Final  . Total Protein 09/08/2023 7.0  6.5 - 8.1 g/dL Final  . Albumin 91/79/7974 3.6  3.5 - 5.0 g/dL Final  . AST 91/79/7974 21  15 - 41 U/L Final  . ALT 09/08/2023 14  0 - 44 U/L Final  . Alkaline Phosphatase 09/08/2023 62  38 - 126 U/L Final  . Total Bilirubin 09/08/2023 0.9  0.0 - 1.2 mg/dL Final  . GFR, Estimated 09/08/2023 >60  >60 mL/min Final   Comment: (  NOTE) Calculated using the CKD-EPI Creatinine Equation (2021)   . Anion gap 09/08/2023 9  5 - 15 Final   Performed at [facility], [address] [place], [place] [address]  . Alcohol, Ethyl (B) 09/08/2023 <15  <15 mg/dL Final   Comment: (NOTE) For medical purposes only. Performed at [facility], [address] [place], [place] [address]   . Opiates 09/08/2023 NONE DETECTED  NONE DETECTED Final  . Cocaine 09/08/2023 NONE DETECTED  NONE DETECTED Final  . Benzodiazepines 09/08/2023 NONE DETECTED  NONE DETECTED Final  . Amphetamines 09/08/2023 NONE DETECTED  NONE DETECTED Final  . Tetrahydrocannabinol 09/08/2023 POSITIVE (A)  NONE DETECTED Final  . Barbiturates 09/08/2023 NONE DETECTED  NONE DETECTED Final   Comment: (NOTE) DRUG SCREEN FOR MEDICAL PURPOSES ONLY.  IF CONFIRMATION IS NEEDED FOR ANY PURPOSE, NOTIFY LAB WITHIN 5 DAYS.  LOWEST DETECTABLE LIMITS FOR URINE DRUG SCREEN Drug Class                     Cutoff (ng/mL) Amphetamine and metabolites    1000 Barbiturate and metabolites    200 Benzodiazepine                  200 Opiates and metabolites        300 Cocaine and metabolites        300 THC                            50 Performed at [facility], [address], [place], [place] [address]   . WBC 09/08/2023 7.5  4.0 - 10.5 K/uL Final  . RBC 09/08/2023 4.60  3.87 - 5.11 MIL/uL Final  . Hemoglobin 09/08/2023 12.8  12.0 - 15.0 g/dL Final  . HCT 91/79/7974 40.7  36.0 - 46.0 % Final  . MCV 09/08/2023 88.5  80.0 - 100.0 fL Final  . MCH 09/08/2023 27.8  26.0 - 34.0 pg Final  . MCHC 09/08/2023 31.4  30.0 - 36.0 g/dL Final  . RDW 91/79/7974 13.2  11.5 - 15.5 % Final  . Platelets 09/08/2023 255  150 - 400 K/uL Final  . nRBC 09/08/2023 0.0  0.0 - 0.2 % Final  . Neutrophils Relative % 09/08/2023 64  % Final  . Neutro Abs 09/08/2023 4.9  1.7 - 7.7 K/uL Final  . Lymphocytes Relative 09/08/2023 24  % Final  . Lymphs Abs 09/08/2023 1.8  0.7 - 4.0 K/uL Final  . Monocytes Relative 09/08/2023 6  % Final  . Monocytes Absolute 09/08/2023 0.4  0.1 - 1.0 K/uL Final  . Eosinophils Relative 09/08/2023 5  % Final  . Eosinophils Absolute 09/08/2023 0.4  0.0 - 0.5 K/uL Final  . Basophils Relative 09/08/2023 1  % Final  . Basophils Absolute 09/08/2023 0.0  0.0 - 0.1 K/uL Final  . Immature Granulocytes 09/08/2023 0  % Final  . Abs Immature Granulocytes 09/08/2023 0.03  0.00 - 0.07 K/uL Final   Performed at [facility], [address] [place], [place] [address]  . Preg, Serum 09/08/2023 NEGATIVE  NEGATIVE Final   Comment:        THE SENSITIVITY OF THIS METHODOLOGY IS >10 mIU/mL. Performed at [facility], [address], [place], [place] [address]     PSYCHIATRIC REVIEW OF SYSTEMS (ROS)  ROS: Notable for the following relevant positive findings: Review of Systems  Psychiatric/Behavioral:  Positive for  depression and suicidal ideas. Negative for hallucinations, memory loss and substance abuse. The patient is nervous/anxious and has insomnia.     Additional findings:      Musculoskeletal:  {Musculoskeletal neeeds/assessment:304550014}      Gait & Station: {Gait and Station:304550016}      Pain Screening: {Pain Description:304550015}      Nutrition & Dental Concerns: {Nutrition & Dental Concerns:304550017}  RISK FORMULATION/ASSESSMENT  Is the patient experiencing any suicidal or homicidal ideations: {yes/no:20286}       Explain if yes: *** Protective factors considered for safety management: ***  Risk factors/concerns considered for safety management: *** {CHL BH Risk Factors Safety Management:304550011}  Is there a safety management plan with the patient and treatment team to minimize risk factors and promote protective factors: {yes/no:20286}           Explain: *** Is crisis care placement or psychiatric hospitalization recommended: {yes/no:20286}     Based on my current evaluation and risk assessment, patient is determined at this time to be at:  {Risk level:304550009}  *RISK ASSESSMENT Risk assessment is a dynamic process; it is possible that this patient's condition, and risk level, may change. This should be re-evaluated and managed over time as appropriate. Please re-consult psychiatric consult services if additional assistance is needed in terms of risk assessment and management. If your team decides to discharge this patient, please advise the patient how to best access emergency psychiatric services, or to call 911, if their condition worsens or they feel unsafe in any way.   Charlene JONELLE Buba, MD Telepsychiatry Consult Services

## 2023-09-08 NOTE — ED Provider Triage Note (Signed)
 Emergency Medicine Provider Triage Evaluation Note  Stacie Powell , a 23 y.o. female  was evaluated in triage.  Pt complains of suicidal ideations.  Patient attempted to take a bunch of oxycodone  earlier this morning but ultimately did not.  She has had suicidal ideations in the past but is never acted on them.  Patient states that she is not actively suicidal but did have that attempt this morning.  She has no somatic complaints at this time.  Review of Systems  Positive:  Negative: See above   Physical Exam  BP 131/84 (BP Location: Right Arm)   Pulse 79   Temp 98.4 F (36.9 C) (Oral)   Resp 16   Ht 5' (1.524 m)   Wt 81.6 kg   SpO2 99%   BMI 35.13 kg/m  Gen:   Awake, no distress   Resp:  Normal effort  MSK:   Moves extremities without difficulty  Other:    Medical Decision Making  Medically screening exam initiated at 2:06 PM.  Appropriate orders placed.  Stacie Powell was informed that the remainder of the evaluation will be completed by another provider, this initial triage assessment does not replace that evaluation, and the importance of remaining in the ED until their evaluation is complete.     Stacie Powell [place], [place] 09/08/23 [phone]

## 2023-09-08 NOTE — ED Provider Notes (Signed)
 Writer called to the [facility] by security where the psychiatric emergency response team had brought patient. Patient tearful, stated that she had placed a handful of her Oxycodone  which was prescribed to her for knee pain in her mouth, but a friend stopped her from swallowing them by pulling the pills out of her mouth. Pt is very tearful, verbalizing feeling very depressed, states that putting the pills in her mouth was an attempt to swallow them with an intent to end her life. She states that she also has a history of self injurious behaviors, with her last self injury via cutting her wrist being last week. No cuts are visible to her wrists. No further assessments are completed at this time, with the exception of vitals which are WNL. Out of caution, we will send patient to the [facility] for medical clearance as she is minimizing the events leading to this presentation. She  might also be accurately revealing or being forthcoming regarding if she swallowed some of the pills or not.  Provider Handoff given to Dr Dean and the provider has agreed to accept the patient. The patient appears reasonably stabilized for transfer considering the current resources, flow, and capabilities available in the UC at this time, and I doubt any other [facility] requiring further screening and/or treatment in the UC prior to transfer is present. Patient can return to the [facility] for further assessments and to await an inpatient bed for treatment and stabilization of her mental health once determined to be medically stable.
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
## 2023-09-08 NOTE — ED Notes (Signed)
 [phone] is phone number of friend who called EMS. Name is Durwood.

## 2023-09-08 NOTE — Discharge Summary (Signed)
 Pt transferred [facility] for med clearance. Pt was transported via non emergent services.

## 2023-09-08 NOTE — Progress Notes (Signed)
 Report called to Abraham Hoffmann, RN/CN [facility].

## 2023-09-08 NOTE — ED Notes (Signed)
 Pt verbalized to EDP that her attempt to take pills this morning was a SI attempt.  Pt is currently denying SI/HI/AVH.

## 2023-09-08 NOTE — ED Notes (Signed)
 TTS in process

## 2023-09-08 NOTE — BH Assessment (Signed)
 TTS Consult will be completed by [facility]. [facility] Coordinator will communicate in established secure chat assessment time and provider name. Thanks

## 2023-09-08 NOTE — ED Triage Notes (Signed)
 Pt BIB EMS from [facility]. Pt is voluntary but will not give much information. [facility] sent pt here for medical clearance. Pt not expressing SI of HI at this time

## 2023-09-08 NOTE — ED Notes (Signed)
 Gave patient sandwich bag

## 2023-09-08 NOTE — Progress Notes (Signed)
   09/08/23 1238  [facility] Triage Screening (Walk-ins at [facility] only)  How Did You Hear About Us ? Legal System  What Is the Reason for Your Visit/Call Today? Pt presents to [facility] escorted by [facility] with attempting to overdose off Oxy. Pt is being sent out for medical clearance. Pt was unable to fully assess.  Determination of Need Urgent (48 hours)  Determination of Need filed? Yes

## 2023-09-08 NOTE — ED Provider Notes (Addendum)
 [place] EMERGENCY DEPARTMENT AT [facility] Provider Note   CSN: [id number] Arrival date & time: 09/08/23  1323     Patient presents with: Medical Clearance   Stacie Powell is a 23 y.o. female brought over to the emergency department via EMS from the behavioral health urgent care.  Patient was seen in the behavioral health urgent care due to a possible suicide attempt earlier today where the patient took a handful of leftover oxycodone  pills from a prior knee surgery and put them in her mouth, patient denies actually swallowing any of these stating that her friend was right there and that she spit them out right after she bit them in her mouth.  Patient currently denies any medical complaint or any symptoms.  Denies vision changes, shortness of breath, drowsiness.  Patient states that this event happened at approximately 10 AM this morning.  Patient states her only other previous suicide attempt was last week where she attempted to cut her wrist to feel something because she felt numb.  Patient unsure if she was attempting to kill herself when taking the medication earlier today. Patient currently denies medical complaint, also denies SI, HI, AVH.  Patient states that she feels like she learned a lot today about her family and her friends supporting her.  Past medical history significant for anxiety and depression, migraines, mild intermittent asthma, etc.   HPI     Prior to Admission medications   Medication Sig Start Date End Date Taking? Authorizing Provider  albuterol (VENTOLIN HFA) 108 (90 Base) MCG/ACT inhaler Inhale 1 puff into the lungs every 6 (six) hours as needed for shortness of breath. 08/02/17  Yes [provider]  sertraline  (ZOLOFT ) 100 MG tablet Take 100 mg by mouth daily. 08/18/23  Yes [provider]  valACYclovir  (VALTREX ) 1000 MG tablet TAKE 1 TABLET(1000 MG) BY MOUTH TWICE DAILY 12/23/22  Yes Chrzanowski, Jami B, NP    Allergies:  Cephalosporins    Review of Systems  Psychiatric/Behavioral:  Positive for suicidal ideas.     Updated Vital Signs BP 119/78 (BP Location: Left Arm)   Pulse 86   Temp 98.4 F (36.9 C) (Oral)   Resp (!) 21   Ht 5' (1.524 m)   Wt 81.6 kg   SpO2 100%   BMI 35.13 kg/m   Physical Exam Vitals and nursing note reviewed.  Constitutional:      General: She is awake. She is not in acute distress.    Appearance: Normal appearance. She is not ill-appearing, toxic-appearing or diaphoretic.  HENT:     Head: Normocephalic and atraumatic.  Eyes:     General: No scleral icterus.    Extraocular Movements: Extraocular movements intact.     Conjunctiva/sclera: Conjunctivae normal.     Pupils: Pupils are equal, round, and reactive to light.  Cardiovascular:     Rate and Rhythm: Normal rate and regular rhythm.  Pulmonary:     Effort: Pulmonary effort is normal. No respiratory distress.     Breath sounds: No wheezing, rhonchi or rales.  Musculoskeletal:        General: Normal range of motion.     Right lower leg: No edema.     Left lower leg: No edema.  Skin:    General: Skin is warm.     Capillary Refill: Capillary refill takes less than 2 seconds.  Neurological:     General: No focal deficit present.     Mental Status: She is alert and  oriented to person, place, and time.  Psychiatric:        Attention and Perception: She does not perceive auditory or visual hallucinations.        Mood and Affect: Mood is depressed. Affect is blunt.        Behavior: Behavior is withdrawn. Behavior is cooperative.        Thought Content: Thought content includes suicidal ideation.     (all labs ordered are listed, but only abnormal results are displayed) Labs Reviewed  COMPREHENSIVE METABOLIC PANEL WITH GFR - Abnormal; Notable for the following components:      Result Value   CO2 21 (*)    Calcium 8.6 (*)    All other components within normal limits  RAPID URINE DRUG SCREEN, HOSP PERFORMED -  Abnormal; Notable for the following components:   Tetrahydrocannabinol POSITIVE (*)    All other components within normal limits  ETHANOL  CBC WITH DIFFERENTIAL/PLATELET  HCG, SERUM, QUALITATIVE    EKG: None  EKG interpreted by myself, no evidence of STEMI, patient currently denies any medical complaint including chest pain or shortness of breath   Radiology: No results found.   Procedures   Medications Ordered in the ED  sertraline  (ZOLOFT ) tablet 100 mg (has no administration in time range)  ibuprofen  (ADVIL ) tablet 800 mg (800 mg Oral Given 09/08/23 1414)                                    Medical Decision Making Risk Prescription drug management. Decision regarding hospitalization.   Patient presents to the ED for concern of possible suicide attempt vs. Exacerbation of depression, this involves an extensive number of treatment options, and is a complaint that carries with it a high risk of complications and morbidity.  The differential diagnosis includes suicide attempt, overdose, major depressive episode, new psych diagnosis, acute stress response, etc.    Co morbidities that complicate the patient evaluation  anxiety and depression, migraines, mild intermittent asthma   Additional history obtained:  Additional history obtained from behavioral health urgent care note today on 09/08/2023 for patient history was briefly taken before being transferred over to the emergency department for medical clearance   Lab Tests:  I Ordered, and personally interpreted labs.  The pertinent results include: CBC and CMP reassuring, pregnancy negative, ethanol less than 15, UDS significant for THC   Medicines ordered and prescription drug management:  I have reviewed the patients home medicines and have made adjustments as needed Patient states only current medication is Sertraline  however medication reconciliation has not been completed yet, will hold off on ordered home meds  at this time until medication rec is completed as this was recommendation from psychiatry to ensure dosing and meds are correct Med rec completed, home sertraline  ordered   Test Considered:  none   Critical Interventions:  Patient IVC'ed   Consultations Obtained:  I requested consultation with the TTS team,  and discussed lab and imaging findings as well as pertinent plan - they recommend: inpatient admission and that patient meets criteria for IVC   Problem List / ED Course:  73 old female, vital signs stable, sent over from behavioral health urgent care due to possibility of taking a handful of oxycodone  pills Vital signs stable here in the emergency department, patient clinically well-appearing, no drowsiness, pupils reactive, patient awake and coherent, low clinical suspicion for overdose During encounter I asked patient  what she overdosed on and she pulled pill bottle containing controlled substance pain medication out of her purse previously prescribed to her, she told me to take the medicine away or discard of it, I turned this over to the nurse taking care of the patient to be discarded of. At this time patient is a psych hold and felt it was unsafe for patient to have this medication that she may have attempted to OD on in her possession.  Patient states that this episode happened at approximately 10 AM, denies medical complaint at this time Medical clearance labs ordered and unremarkable, patient only positive for THC on drug screen Patient does not want to go back to behavioral urgent care however is agreeable to TTS consult here, past medical history significant for only anxiety and depression, states that she has good follow-up with psychiatry as well as therapy, friends and family supportive at bedside Patient medically cleared Will place consult to TTS and psych hold orders TTS recommends inpatient admission, states that patient does meet criteria for IVC  Patient  IVC'ed Patient updated about plan, patient states that she takes sertraline  100 mg at home, however I see no record of this in the chart today, medication reconciliation has not yet been completed, will wait to order this medication until med rec has been completed Med rec completed and sertraline  ordered at home dose  Patient accepted for inpatient admission  This likely diagnosis at this time is exacerbation of depression and anxiety resulting in suicide attempt, I think this patient would benefit from inpatient admission and ongoing diagnosis and treatment by the psychiatry team, patient did meet criteria for IVC based on 2 recent suicide attempts, psych hold orders placed, home meds ordered, patient still denies medical complaint at this time   Reevaluation:  After the interventions noted above, I reevaluated the patient and found that they have :stayed the same   Social Determinants of Health:  Suicide attempts   Dispostion:  After consideration of the diagnostic results and the patients response to treatment, I feel that the patent would benefit from inpatient admission per TTS note.     Final diagnoses:  Anxiety  Depression, unspecified depression type    ED Discharge Orders     None          Janetta Terrall FALCON, PA-C 09/09/23 0216    Janetta Terrall FALCON, [place] 09/10/23 0052    Armenta Canning, MD 09/13/23 1128

## 2023-09-09 ENCOUNTER — Encounter (HOSPITAL_COMMUNITY): Payer: Self-pay | Admitting: Psychiatry

## 2023-09-09 ENCOUNTER — Inpatient Hospital Stay (HOSPITAL_COMMUNITY)
Admission: AD | Admit: 2023-09-09 | Discharge: 2023-09-10 | DRG: 882 | Disposition: A | Discharge status: Home / Self Care | Source: Intra-hospital

## 2023-09-09 ENCOUNTER — Other Ambulatory Visit: Payer: Self-pay

## 2023-09-09 DIAGNOSIS — F17290 Nicotine dependence, other tobacco product, uncomplicated: Secondary | ICD-10-CM | POA: Diagnosis present

## 2023-09-09 DIAGNOSIS — J4520 Mild intermittent asthma, uncomplicated: Secondary | ICD-10-CM | POA: Diagnosis present

## 2023-09-09 DIAGNOSIS — Z9151 Personal history of suicidal behavior: Secondary | ICD-10-CM

## 2023-09-09 DIAGNOSIS — F329 Major depressive disorder, single episode, unspecified: Secondary | ICD-10-CM | POA: Diagnosis present

## 2023-09-09 DIAGNOSIS — Z833 Family history of diabetes mellitus: Secondary | ICD-10-CM | POA: Diagnosis not present

## 2023-09-09 DIAGNOSIS — F419 Anxiety disorder, unspecified: Secondary | ICD-10-CM | POA: Diagnosis not present

## 2023-09-09 DIAGNOSIS — F4323 Adjustment disorder with mixed anxiety and depressed mood: Principal | ICD-10-CM | POA: Diagnosis present

## 2023-09-09 DIAGNOSIS — G4700 Insomnia, unspecified: Secondary | ICD-10-CM | POA: Diagnosis present

## 2023-09-09 DIAGNOSIS — Z881 Allergy status to other antibiotic agents status: Secondary | ICD-10-CM | POA: Diagnosis not present

## 2023-09-09 DIAGNOSIS — Z800 Family history of malignant neoplasm of digestive organs: Secondary | ICD-10-CM | POA: Diagnosis not present

## 2023-09-09 DIAGNOSIS — Z825 Family history of asthma and other chronic lower respiratory diseases: Secondary | ICD-10-CM | POA: Diagnosis not present

## 2023-09-09 DIAGNOSIS — Z8249 Family history of ischemic heart disease and other diseases of the circulatory system: Secondary | ICD-10-CM

## 2023-09-09 DIAGNOSIS — R45851 Suicidal ideations: Secondary | ICD-10-CM | POA: Diagnosis present

## 2023-09-09 MED ORDER — LORAZEPAM 2 MG/ML IJ SOLN: 2.0000 mg | Freq: Three times a day (TID) | INTRAMUSCULAR | Status: DC | PRN

## 2023-09-09 MED ORDER — ALUM & MAG HYDROXIDE-SIMETH 200-200-20 MG/5ML PO SUSP: 30.0000 mL | ORAL | Status: DC | PRN

## 2023-09-09 MED ORDER — DIPHENHYDRAMINE HCL 25 MG PO CAPS: 50.0000 mg | ORAL_CAPSULE | Freq: Three times a day (TID) | ORAL | Status: DC | PRN

## 2023-09-09 MED ORDER — OLANZAPINE 10 MG IM SOLR: 10.0000 mg | Freq: Three times a day (TID) | INTRAMUSCULAR | Status: DC | PRN

## 2023-09-09 MED ORDER — HALOPERIDOL LACTATE 5 MG/ML IJ SOLN: 5.0000 mg | Freq: Three times a day (TID) | INTRAMUSCULAR | Status: DC | PRN

## 2023-09-09 MED ORDER — DIPHENHYDRAMINE HCL 50 MG/ML IJ SOLN: 50.0000 mg | Freq: Three times a day (TID) | INTRAMUSCULAR | Status: DC | PRN

## 2023-09-09 MED ORDER — MAGNESIUM HYDROXIDE 400 MG/5ML PO SUSP: 30.0000 mL | Freq: Every day | ORAL | Status: DC | PRN

## 2023-09-09 MED ORDER — ACETAMINOPHEN 325 MG PO TABS: 650.0000 mg | ORAL_TABLET | Freq: Four times a day (QID) | ORAL | Status: DC | PRN

## 2023-09-09 MED ORDER — HALOPERIDOL 5 MG PO TABS: 5.0000 mg | ORAL_TABLET | Freq: Three times a day (TID) | ORAL | Status: DC | PRN

## 2023-09-09 MED ORDER — SERTRALINE HCL 100 MG PO TABS
100.0000 mg | ORAL_TABLET | Freq: Every day | ORAL | Status: DC
Start: 2023-09-09 — End: 2023-09-09

## 2023-09-09 NOTE — ED Notes (Signed)
 RN spoke with patient for a while regarding her fear of losing job due to being IVC for 7 days; RN will assist patient in contacting her job regarding missing days; Patient is tearful and dismissive of the events that took place tonight; Pt states she has only attempted SI x 1 not 2. Pt will call mother first thing in the morning; pt given Ice packs to cool her down by request-Monique,RN

## 2023-09-09 NOTE — Progress Notes (Signed)
   09/09/23 1035  Psych Admission Type (Psych Patients Only)  Admission Status Involuntary  Psychosocial Assessment  Patient Complaints Anxiety  Eye Contact Fair  Facial Expression Flat  Affect Sad  Speech Soft  Interaction Cautious  Motor Activity Slow  Appearance/Hygiene Unremarkable  Behavior Characteristics Cooperative;Anxious  Mood Depressed;Anxious  Thought Process  Coherency WDL  Content WDL  Delusions None reported or observed  Perception WDL  Hallucination None reported or observed  Judgment Impaired  Confusion Mild  Danger to Self  Current suicidal ideation? Denies  Description of Suicide Plan No Plan  Agreement Not to Harm Self Yes  Description of Agreement Verbal Contract  Danger to Others  Danger to Others None reported or observed

## 2023-09-09 NOTE — Group Note (Signed)
 Date:  09/09/2023 Time:  8:59 PM  Group Topic/Focus:  Wrap-Up Group:   The focus of this group is to help patients review their daily goal of treatment and discuss progress on daily workbooks.    Participation Level:  Active  Participation Quality:  Appropriate and Sharing  Affect:  Appropriate  Cognitive:  Appropriate  Insight: Appropriate  Engagement in Group:  Engaged  Modes of Intervention:  Activity and Socialization  Additional Comments:  Patient shares her appreciations with others on the unit for helping her since being on the unit. Patient was admitted onto the unit today. Patient shares that she had a overwhelming day in the beginning but things got better. Patient shares that while here she would like to work on coping skills and mechanisms.   Eward Mace 09/09/2023, 8:59 PM

## 2023-09-09 NOTE — Progress Notes (Signed)
 Patient admitted to [facility] to room 400-2. Pt is IVC. Pt had a potential SA with Oxycodone . Pt was stopped before she could swallow. Pt has a HX of anxiety and depression and does see a therapist weekly. Pt denies denies SI,HI, and AVH. Pt states that the pills going in her mouth was an accident. Pt does endorse having passive SI thoughts for the last few weeks. All pt's belongings were secured in her locker. Skin check was completed with the help of Dorn, MHT. No contraband was found and skin was CDI with multiple tattoos. All admission paperwork was signed. Patient was offered fluid and nutritions. Pt was oriented to the unit. Pt was given a chance to ask any questions. Pt was encouraged to come to staff with any questions or concerns.

## 2023-09-09 NOTE — ED Notes (Signed)
 Report called to [facility] facility, all pt belongings including at home meds sent with [employer] to transfer facility. Pt called mother and job prior to departure, agreeable with plan of care. VSS

## 2023-09-09 NOTE — Progress Notes (Signed)
 Pt has been accepted to [facility] on 09/09/2023 Bed assignment: 400-02  Pt meets inpatient criteria per: Charlene Buba MD   Attending Physician will be: Dr. Prentis    Report can be called un:lwpu:[place] unit: [phone]  Pt can arrive after ASAP  Care Team Notified: [facility]  Bretta Qua RN, Judene Moats RN  [place] Kherington Meraz LCSW-A   09/09/2023 8:16 AM

## 2023-09-09 NOTE — ED Notes (Signed)
 ED Provider at bedside.

## 2023-09-09 NOTE — ED Notes (Signed)
IVC paperwork in progress 

## 2023-09-09 NOTE — Group Note (Signed)
 Occupational Therapy Group Note  Group Topic: Sleep Hygiene  Group Date: 09/09/2023 Start Time: 1500 End Time: 1530 Facilitators: Dot [place] MATSU, OT   Group Description: Group encouraged increased participation and engagement through topic focused on sleep hygiene. Patients reflected on the quality of sleep they typically receive and identified areas that need improvement. Group was given background information on sleep and sleep hygiene, including common sleep disorders. Group members also received information on how to improve one's sleep and introduced a sleep diary as a tool that can be utilized to track sleep quality over a length of time. Group session ended with patients identifying one or more strategies they could utilize or implement into their sleep routine in order to improve overall sleep quality.        Therapeutic Goal(s):  Identify one or more strategies to improve overall sleep hygiene  Identify one or more areas of sleep that are negatively impacted (sleep too much, too little, etc)     Participation Level: Engaged   Participation Quality: Independent   Behavior: Appropriate   Speech/Thought Process: Relevant   Affect/Mood: Appropriate   Insight: Fair   Judgement: Fair      Modes of Intervention: Education  Patient Response to Interventions:  Attentive   Plan: Continue to engage patient in OT groups 2 - 3x/week.  09/09/2023  [place] MATSU Dot, OT  Anik Wesch, OT

## 2023-09-09 NOTE — [facility] Suicide Risk Assessment (Signed)
 [facility] INPATIENT:  Family/Significant Other Suicide Prevention Education  Suicide Prevention Education:  Education Completed; Stacie Powell (mother) [phone],  (name of family member/significant other) has been identified by the patient as the family member/significant other with whom the patient will be residing, and identified as the person(s) who will aid the patient in the event of a mental health crisis (suicidal ideations/suicide attempt).  With written consent from the patient, the family member/significant other has been provided the following suicide prevention education, prior to the and/or following the discharge of the patient.  The suicide prevention education provided includes the following: Suicide risk factors Suicide prevention and interventions National Suicide Hotline telephone number [facility] assessment telephone number [facility] Emergency Assistance 911 [facility] and/or Residential Mobile Crisis Unit telephone number  Request made of family/significant other to: Remove weapons (e.g., guns, rifles, knives), all items previously/currently identified as safety concern.   Remove drugs/medications (over-the-counter, prescriptions, illicit drugs), all items previously/currently identified as a safety concern.  Patient's mother denies presence of any weapons in the home and agrees to safely store away OTC and prescription medications. Mother agrees to allow patient to stay at her home and will be providing transportation upon discharge.   The family member/significant other verbalizes understanding of the suicide prevention education information provided.  The family member/significant other agrees to remove the items of safety concern listed above.  Stacie Powell 09/09/2023, 4:11 PM

## 2023-09-09 NOTE — Tx Team (Signed)
 Initial Treatment Plan 09/09/2023 11:48 AM Stacie Powell FMW:[id number]    PATIENT STRESSORS: Financial difficulties   Medication change or noncompliance   Occupational concerns     PATIENT STRENGTHS: Capable of independent living  Supportive family/friends    PATIENT IDENTIFIED PROBLEMS: Financial  Housing  Coping                 DISCHARGE CRITERIA:  Improved stabilization in mood, thinking, and/or behavior Motivation to continue treatment in a less acute level of care Verbal commitment to aftercare and medication compliance  PRELIMINARY DISCHARGE PLAN: Outpatient therapy Return to previous living arrangement  PATIENT/FAMILY INVOLVEMENT: This treatment plan has been presented to and reviewed with the patient, Stacie Powell.  The patient and family have been given the opportunity to ask questions and make suggestions.  [name], RN 09/09/2023, 11:48 AM
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
## 2023-09-09 NOTE — ED Notes (Signed)
 IVC paperwork complete and in purple zone, expires 09/16/23, case # [id number]

## 2023-09-09 NOTE — Plan of Care (Signed)
   Problem: Education: Goal: Knowledge of [facility] Education information/materials will improve Outcome: Progressing Goal: Verbalization of understanding the information provided will improve Outcome: Progressing

## 2023-09-10 DIAGNOSIS — F419 Anxiety disorder, unspecified: Secondary | ICD-10-CM

## 2023-09-10 DIAGNOSIS — F4323 Adjustment disorder with mixed anxiety and depressed mood: Principal | ICD-10-CM

## 2023-09-10 MED ORDER — CLONIDINE HCL 0.1 MG PO TABS: 0.1000 mg | ORAL_TABLET | Freq: Every evening | ORAL | 0 refills | Status: AC

## 2023-09-10 MED ORDER — SERTRALINE HCL 100 MG PO TABS
100.0000 mg | ORAL_TABLET | Freq: Every day | ORAL | Status: DC
  Administered 2023-09-10: 100 mg via ORAL
  Filled 2023-09-10: qty 1

## 2023-09-10 MED ORDER — SERTRALINE HCL 100 MG PO TABS: 100.0000 mg | ORAL_TABLET | Freq: Every day | ORAL | 0 refills | Status: AC

## 2023-09-10 NOTE — Progress Notes (Signed)
 Discharge Note:  Pt discharged to lobby where her mother was waiting to take her home. Pt was stable and appreciative at that time. All discharge paperwork was explained and prescriptions were given. Verbal understanding was expressed. All contents of pt's locker was returned and signed for by the patient. Patient denies SI/HI, A/VH and pain. Pt was given the opportunity to express concerns and ask any questions.
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
## 2023-09-10 NOTE — Group Note (Signed)
 Date:  09/10/2023 Time:  9:26 AM  Group Topic/Focus: Positivity  The goal of this group was for each person to write their name in the middle of a piece of paper and pass it around the dayroom to their peers. Each person was to write a positive comment anonymously about each other. Each patient was allowed to share what was said and reflect on their goals for today.   Participation Level:  Did Not Attend  Participation Quality:  N/A  Affect:  N/A  Cognitive:  N/A  Insight: None  Engagement in Group:  N/A  Modes of Intervention:  N/A  Additional Comments:  N/A  Kristi CHRISTELLA Plaza 09/10/2023, 9:26 AM

## 2023-09-10 NOTE — Group Note (Signed)
 Date:  09/10/2023 Time:  10:42 AM  Group Topic/Focus:  Goals Group:   The focus of this group is to help patients establish daily goals to achieve during treatment and discuss how the patient can incorporate goal setting into their daily lives to aide in recovery.    Participation Level:  Did Not Attend  Participation Quality:  N/A  Affect:  N/A  Cognitive:  N/A  Insight: None  Engagement in Group:  N/A  Modes of Intervention:  N/A  Additional Comments:  N/A  Kristi CHRISTELLA Plaza 09/10/2023, 10:42 AM

## 2023-09-10 NOTE — Progress Notes (Signed)
 Pt requested that nurse waste her medication that was brought to the hospital. 26 tabs of oxycodone  that expired in 2019. Pt didn't feel safe leaving with them. Medication was wasted in the med room on the 300 hall by two RN's.  Inocente, RN Lonell, RN Receipt verifying count and waste signed by both nurses will be placed in pt's chart.

## 2023-09-10 NOTE — Progress Notes (Signed)
  [facility] Adult Case Management Discharge Plan :  Will you be returning to the same living situation after discharge:  No. Patient will be staying with mother, Stacie Powell, for a few days before returning home to address on file. At discharge, do you have transportation home?: Yes,  mother will be picking patient up at 11:00 AM. Do you have the ability to pay for your medications: Yes,  patient has active health insurance.  Release of information consent forms completed and in the chart;  Patient's signature needed at discharge.  Patient to Follow up at:  Follow-up Information     [facility] Follow up on 09/13/2023.   Why: Please call this provider on 09/13/23 at 9:00 am if you wish to schedule an appointment for medication management services. Contact information: [address] [place] [address] [phone]         [facility] Follow up on 09/13/2023.   Why: Please go online on 09/13/23 at 9:00 am to schedule an appointment with your therapist. Contact information: [email or web address]                Next level of care provider has access to [facility] Link:no  Safety Planning and Suicide Prevention discussed: Yes,  completed with patient's mother, Stacie Powell, [phone].      Has patient been referred to the Quitline?: Patient refused referral for treatment  Patient has been referred for addiction treatment: No known substance use disorder.  Louetta Lame, LCSWA 09/10/2023, 8:50 AM

## 2023-09-10 NOTE — Progress Notes (Signed)
   09/10/23 0900  Psych Admission Type (Psych Patients Only)  Admission Status Involuntary  Psychosocial Assessment  Patient Complaints None  Eye Contact Fair  Facial Expression Flat  Affect Flat  Speech Soft  Interaction Assertive  Motor Activity Slow  Appearance/Hygiene Unremarkable  Behavior Characteristics Cooperative  Mood Pleasant  Thought Process  Coherency WDL  Content WDL  Delusions None reported or observed  Perception WDL  Hallucination None reported or observed  Judgment WDL  Confusion None  Danger to Self  Current suicidal ideation? Denies  Description of Suicide Plan No plan  Agreement Not to Harm Self Yes  Description of Agreement Verbal Contract  Danger to Others  Danger to Others None reported or observed

## 2023-09-10 NOTE — Progress Notes (Signed)
(  Sleep Hours) - 7.5 hours (Any PRNs that were needed, meds refused, or side effects to meds)- none (Any disturbances and when (visitation, over night) (Concerns raised by the patient)- none (SI/HI/AVH)- denies

## 2023-09-10 NOTE — Plan of Care (Signed)

## 2023-09-11 NOTE — Discharge Summary (Signed)
 Physician Discharge Summary Note  Patient:  Stacie Powell is an 23 y.o., female MRN:  [id number] DOB:  10/18/2000 Patient phone:  [phone] (home)  Patient address:   [address] [place] [address],  Total Time spent with patient: 45 minutes  Date of Admission:  09/09/2023 Date of Discharge: 09/10/2023  Reason for Admission:  The patient is a 23 y/o female with a history of anxiety who was admitted involuntarily for a reported suicide attempt via overdose with oxycodone .   Principal Problem: Adjustment disorder with mixed anxiety and depressed mood Discharge Diagnoses: Principal Problem:   Adjustment disorder with mixed anxiety and depressed mood Active Problems:   Anxiety   Past Psychiatric History: No prior psychiatric hospitalizations reported. No history of suicidal behaviors reported. She reports one recent episode of minor self-harm, but not extensive history. She has been engaged in individual therapy for about the past year and she has been seeing K[facility] via [facility] and has been prescribed sertraline  for anxiety. She reports a prior trial with escitalopram  for anxiety back in 2018 that caused suicidal ideation.   Past Medical History:  Past Medical History:  Diagnosis Date   Anxiety    Asthma    Frequent headaches    Migraines     Past Surgical History:  Procedure Laterality Date   KNEE SURGERY     WISDOM TOOTH EXTRACTION     Family History:  Family History  Problem Relation Age of Onset   Migraines Mother    Asthma Mother    Diabetes Mother    Migraines Father    Heart attack Father    Asthma Brother    Migraines Brother    Diabetes Paternal Aunt    Colon cancer Maternal Grandmother    Heart attack Paternal Grandmother    Diabetes Paternal Grandmother    Diabetes Paternal Grandfather    Diabetes Cousin        paternal 2nd cousins   Hospital Course:  The patient was admitted to inpatient psychiatry involuntarily for suicidal  behavior. As documented in the H&P the patient appeared significantly improved by initial assessment. She denied every experiencing genuine suicidal ideation or having any prior actual plan or preparatory behavior. There was a discrepancy between her account and her friend's account per the medical record, but this was of unclear significance, and collateral information obtained from the patient's mother did not reveal the same level of concern. Her UDS was also noted to be negative for oxycodone . Also, of note, she had brought the bottle of oxycodone  to the hospital and requested hospital staff dispose of it for her, which staff did. The patient's medications were adjusted. Sertraline  was decreased to 100 mg daily given the concern that she was experiencing side effects at the 150 mg dose. She was also started on a trial of clonidine  0.1 mg at bedtime as needed for insomnia at discharge given that this was an identified concern of hers. During this short admission she demonstrated safe and appropriate behaviors throughout. Her affect was full and did not appear consistent with severe depression and she fully participated in milieu activities and assessment. She had pre-established providers for therapy and medication management and will continue to see them after discharge. On the day of discharge she reported a mildly low but not significantly depressed mood with minimal anxiety and denied suicidal ideation.   MSK - Normal gait and station  Mental Status Exam: Appearance - Casually dressed. Appropriate hygiene. NAD Attitude -  Calm, polite, open Speech - normal volume, prosody, inflection Mood - Pretty good Affect - Full Thought Process - LLGD Thought Content - No delusional TC expressed SI/HI - Denies Perceptions - Denies; not RIS Judgement/Insight - Good  Fund of knowledge - WNL Language - No impairments   Physical Exam Vitals reviewed.  Constitutional:      Appearance: Normal appearance.   HENT:     Head: Normocephalic and atraumatic.  Eyes:     Extraocular Movements: Extraocular movements intact.  Pulmonary:     Effort: Pulmonary effort is normal.  Musculoskeletal:        General: Normal range of motion.  Neurological:     Mental Status: She is alert.    Review of Systems  Constitutional: Negative.   Respiratory: Negative.    Cardiovascular: Negative.    Blood pressure 129/78, pulse 78, temperature 98.2 F (36.8 C), temperature source Oral, resp. rate 18, height 5' (1.524 m), weight 92.1 kg, SpO2 99%. Body mass index is 39.65 kg/m.   Social History   Tobacco Use  Smoking Status Every Day   Types: E-cigarettes  Smokeless Tobacco Never  Tobacco Comments   Vapes   Tobacco Cessation:  A prescription for an FDA-approved tobacco cessation medication was offered at discharge and the patient refused   Blood Alcohol level:  Lab Results  Component Value Date   [facility] <15 09/08/2023    Metabolic Disorder Labs:  No results found for: HGBA1C, MPG No results found for: PROLACTIN No results found for: CHOL, TRIG, HDL, CHOLHDL, VLDL, LDLCALC  See Psychiatric Specialty Exam and Suicide Risk Assessment completed by Attending Physician prior to discharge.  Discharge destination:  Home  Is patient on multiple antipsychotic therapies at discharge:  No    Discharge Instructions     Increase activity slowly   Complete by: As directed       Allergies as of 09/10/2023       Reactions   Cephalosporins Hives, Other (See Comments)   Patient stated this happened when she was a baby/infant.        Medication List     STOP taking these medications    albuterol 108 (90 Base) MCG/ACT inhaler Commonly known as: VENTOLIN HFA       TAKE these medications      Indication  cloNIDine  0.1 MG tablet Commonly known as: CATAPRES  Take 1 tablet (0.1 mg total) by mouth at bedtime.  Indication: insomnia   sertraline  100 MG tablet Commonly known  as: ZOLOFT  Take 1 tablet (100 mg total) by mouth daily for 14 days.  Indication: Generalized Anxiety Disorder   valACYclovir  1000 MG tablet Commonly known as: VALTREX  TAKE 1 TABLET(1000 MG) BY MOUTH TWICE DAILY  Indication: Herpes Simplex Infection        Follow-up Information     [facility] Follow up on 09/13/2023.   Why: Please call this provider on 09/13/23 at 9:00 am if you wish to schedule an appointment for medication management services. Contact information: [address] [place] [address] [phone]         [facility] Follow up on 09/13/2023.   Why: Please go online on 09/13/23 at 9:00 am to schedule an appointment with your therapist. Contact information: [email or web address]                Follow-up recommendations:  Take all medications as prescribed; abstain from alcohol use; attend all follow-up appointments as scheduled above.    Signed: Oliva  A Nolah Krenzer, DO 09/11/2023, 12:56 PM

## 2024-01-07 ENCOUNTER — Telehealth

## 2024-01-11 ENCOUNTER — Other Ambulatory Visit: Payer: Self-pay

## 2024-01-11 DIAGNOSIS — N9089 Other specified noninflammatory disorders of vulva and perineum: Secondary | ICD-10-CM

## 2024-01-11 NOTE — Telephone Encounter (Signed)
 Med refill request: valacyclovir  1 gram Last AEX: 04/07/22 Next AEX: none Last MMG (if hormonal med) n/a  Refill authorized: valacyclovir  1 gram.  Sent to provider for review.

## 2024-01-14 MED ORDER — VALACYCLOVIR HCL 1 G PO TABS: 500.0000 mg | ORAL_TABLET | Freq: Two times a day (BID) | ORAL | 1 refills | Status: AC
